# Patient Record
Sex: Male | Born: 1935 | Race: Black or African American | Hispanic: No | Marital: Married | Smoking: Never smoker
Health system: Southern US, Community
[De-identification: ages and names within clinical notes are randomized; demographics above are authoritative.]

## PROBLEM LIST (undated history)

## (undated) DIAGNOSIS — I10 Essential (primary) hypertension: Secondary | ICD-10-CM

## (undated) DIAGNOSIS — I2510 Atherosclerotic heart disease of native coronary artery without angina pectoris: Secondary | ICD-10-CM

---

## 2010-01-31 ENCOUNTER — Emergency Department: Payer: Self-pay | Admitting: Emergency Medicine

## 2010-02-14 ENCOUNTER — Emergency Department: Payer: Self-pay | Admitting: Internal Medicine

## 2018-07-20 ENCOUNTER — Observation Stay
Admission: EM | Admit: 2018-07-20 | Discharge: 2018-07-21 | Disposition: A | Discharge status: Home / Self Care | Payer: Medicare Other | Attending: Internal Medicine | Admitting: Internal Medicine

## 2018-07-20 ENCOUNTER — Observation Stay: Payer: Medicare Other

## 2018-07-20 ENCOUNTER — Emergency Department: Payer: Medicare Other

## 2018-07-20 ENCOUNTER — Other Ambulatory Visit: Payer: Self-pay

## 2018-07-20 ENCOUNTER — Observation Stay (HOSPITAL_BASED_OUTPATIENT_CLINIC_OR_DEPARTMENT_OTHER)
Admit: 2018-07-20 | Discharge: 2018-07-20 | Disposition: A | Discharge status: Home / Self Care | Payer: Medicare Other | Attending: Cardiovascular Disease | Admitting: Cardiovascular Disease

## 2018-07-20 ENCOUNTER — Encounter: Payer: Self-pay | Admitting: Emergency Medicine

## 2018-07-20 DIAGNOSIS — K5730 Diverticulosis of large intestine without perforation or abscess without bleeding: Secondary | ICD-10-CM | POA: Diagnosis not present

## 2018-07-20 DIAGNOSIS — K219 Gastro-esophageal reflux disease without esophagitis: Secondary | ICD-10-CM | POA: Insufficient documentation

## 2018-07-20 DIAGNOSIS — N1330 Unspecified hydronephrosis: Secondary | ICD-10-CM | POA: Diagnosis not present

## 2018-07-20 DIAGNOSIS — I714 Abdominal aortic aneurysm, without rupture: Secondary | ICD-10-CM | POA: Diagnosis not present

## 2018-07-20 DIAGNOSIS — I10 Essential (primary) hypertension: Secondary | ICD-10-CM | POA: Insufficient documentation

## 2018-07-20 DIAGNOSIS — K529 Noninfective gastroenteritis and colitis, unspecified: Secondary | ICD-10-CM

## 2018-07-20 DIAGNOSIS — I513 Intracardiac thrombosis, not elsewhere classified: Secondary | ICD-10-CM | POA: Diagnosis not present

## 2018-07-20 DIAGNOSIS — J439 Emphysema, unspecified: Secondary | ICD-10-CM | POA: Diagnosis not present

## 2018-07-20 DIAGNOSIS — Z79899 Other long term (current) drug therapy: Secondary | ICD-10-CM | POA: Insufficient documentation

## 2018-07-20 DIAGNOSIS — Z720 Tobacco use: Secondary | ICD-10-CM

## 2018-07-20 DIAGNOSIS — I6523 Occlusion and stenosis of bilateral carotid arteries: Secondary | ICD-10-CM | POA: Diagnosis not present

## 2018-07-20 DIAGNOSIS — Z7902 Long term (current) use of antithrombotics/antiplatelets: Secondary | ICD-10-CM

## 2018-07-20 DIAGNOSIS — I723 Aneurysm of iliac artery: Secondary | ICD-10-CM | POA: Insufficient documentation

## 2018-07-20 DIAGNOSIS — R55 Syncope and collapse: Secondary | ICD-10-CM

## 2018-07-20 DIAGNOSIS — I2510 Atherosclerotic heart disease of native coronary artery without angina pectoris: Secondary | ICD-10-CM | POA: Insufficient documentation

## 2018-07-20 DIAGNOSIS — R112 Nausea with vomiting, unspecified: Secondary | ICD-10-CM | POA: Insufficient documentation

## 2018-07-20 DIAGNOSIS — E785 Hyperlipidemia, unspecified: Secondary | ICD-10-CM | POA: Insufficient documentation

## 2018-07-20 DIAGNOSIS — K4090 Unilateral inguinal hernia, without obstruction or gangrene, not specified as recurrent: Secondary | ICD-10-CM | POA: Insufficient documentation

## 2018-07-20 DIAGNOSIS — F17210 Nicotine dependence, cigarettes, uncomplicated: Secondary | ICD-10-CM | POA: Insufficient documentation

## 2018-07-20 DIAGNOSIS — F17200 Nicotine dependence, unspecified, uncomplicated: Secondary | ICD-10-CM | POA: Diagnosis not present

## 2018-07-20 DIAGNOSIS — I160 Hypertensive urgency: Secondary | ICD-10-CM | POA: Diagnosis not present

## 2018-07-20 DIAGNOSIS — N400 Enlarged prostate without lower urinary tract symptoms: Secondary | ICD-10-CM | POA: Insufficient documentation

## 2018-07-20 DIAGNOSIS — I493 Ventricular premature depolarization: Secondary | ICD-10-CM

## 2018-07-20 DIAGNOSIS — N281 Cyst of kidney, acquired: Secondary | ICD-10-CM | POA: Insufficient documentation

## 2018-07-20 DIAGNOSIS — Z7982 Long term (current) use of aspirin: Secondary | ICD-10-CM | POA: Diagnosis not present

## 2018-07-20 DIAGNOSIS — I739 Peripheral vascular disease, unspecified: Secondary | ICD-10-CM

## 2018-07-20 DIAGNOSIS — I25118 Atherosclerotic heart disease of native coronary artery with other forms of angina pectoris: Secondary | ICD-10-CM | POA: Diagnosis not present

## 2018-07-20 HISTORY — DX: Essential (primary) hypertension: I10

## 2018-07-20 HISTORY — DX: Atherosclerotic heart disease of native coronary artery without angina pectoris: I25.10

## 2018-07-20 LAB — CBC WITH DIFFERENTIAL/PLATELET
Abs Immature Granulocytes: 0.02 10*3/uL (ref 0.00–0.07)
Basophils Absolute: 0.1 10*3/uL (ref 0.0–0.1)
Basophils Relative: 1 %
Eosinophils Absolute: 0.2 10*3/uL (ref 0.0–0.5)
Eosinophils Relative: 3 %
HCT: 43.5 % (ref 39.0–52.0)
Hemoglobin: 13.8 g/dL (ref 13.0–17.0)
Immature Granulocytes: 0 %
Lymphocytes Relative: 33 %
Lymphs Abs: 2.3 10*3/uL (ref 0.7–4.0)
MCH: 27.2 pg (ref 26.0–34.0)
MCHC: 31.7 g/dL (ref 30.0–36.0)
MCV: 85.6 fL (ref 80.0–100.0)
MONOS PCT: 10 %
Monocytes Absolute: 0.7 10*3/uL (ref 0.1–1.0)
Neutro Abs: 3.6 10*3/uL (ref 1.7–7.7)
Neutrophils Relative %: 53 %
Platelets: 182 10*3/uL (ref 150–400)
RBC: 5.08 MIL/uL (ref 4.22–5.81)
RDW: 14.7 % (ref 11.5–15.5)
WBC: 7 10*3/uL (ref 4.0–10.5)
nRBC: 0 % (ref 0.0–0.2)

## 2018-07-20 LAB — ECHOCARDIOGRAM COMPLETE
Height: 69 in
Weight: 3152 oz

## 2018-07-20 LAB — COMPREHENSIVE METABOLIC PANEL
ALT: 14 U/L (ref 0–44)
AST: 17 U/L (ref 15–41)
Albumin: 3.7 g/dL (ref 3.5–5.0)
Alkaline Phosphatase: 61 U/L (ref 38–126)
Anion gap: 9 (ref 5–15)
BUN: 20 mg/dL (ref 8–23)
CHLORIDE: 105 mmol/L (ref 98–111)
CO2: 25 mmol/L (ref 22–32)
CREATININE: 1.11 mg/dL (ref 0.61–1.24)
Calcium: 8.7 mg/dL — ABNORMAL LOW (ref 8.9–10.3)
GFR calc Af Amer: >60 mL/min (ref >60)
GFR calc non Af Amer: >60 mL/min (ref >60)
Glucose, Bld: 161 mg/dL — ABNORMAL HIGH (ref 70–99)
Potassium: 3.9 mmol/L (ref 3.5–5.1)
Sodium: 139 mmol/L (ref 135–145)
Total Bilirubin: 0.5 mg/dL (ref 0.3–1.2)
Total Protein: 6.5 g/dL (ref 6.5–8.1)

## 2018-07-20 LAB — LIPASE, BLOOD: Lipase: 41 U/L (ref 11–51)

## 2018-07-20 LAB — TROPONIN I: Troponin I: <0.03 ng/mL (ref <0.03)

## 2018-07-20 MED ORDER — TRAZODONE HCL 50 MG PO TABS: 25.0000 mg | ORAL_TABLET | Freq: Every evening | ORAL | Status: DC | PRN

## 2018-07-20 MED ORDER — FENTANYL CITRATE (PF) 100 MCG/2ML IJ SOLN
INTRAMUSCULAR | Status: AC
  Filled 2018-07-20: qty 2

## 2018-07-20 MED ORDER — SODIUM CHLORIDE 0.9% FLUSH
3.0000 mL | Freq: Two times a day (BID) | INTRAVENOUS | Status: DC
  Administered 2018-07-20: 3 mL via INTRAVENOUS

## 2018-07-20 MED ORDER — NITROGLYCERIN 2 % TD OINT
0.5000 [in_us] | TOPICAL_OINTMENT | Freq: Once | TRANSDERMAL | Status: AC
  Administered 2018-07-20: 0.5 [in_us] via TOPICAL
  Filled 2018-07-20: qty 1

## 2018-07-20 MED ORDER — FENTANYL CITRATE (PF) 100 MCG/2ML IJ SOLN
50.0000 ug | Freq: Once | INTRAMUSCULAR | Status: AC
  Administered 2018-07-20: 50 ug via INTRAVENOUS
  Filled 2018-07-20: qty 2

## 2018-07-20 MED ORDER — METOCLOPRAMIDE HCL 5 MG/ML IJ SOLN: 10.0000 mg | Freq: Three times a day (TID) | INTRAMUSCULAR | Status: DC | PRN

## 2018-07-20 MED ORDER — ENOXAPARIN SODIUM 40 MG/0.4ML ~~LOC~~ SOLN
40.0000 mg | SUBCUTANEOUS | Status: DC
  Filled 2018-07-20: qty 0.4

## 2018-07-20 MED ORDER — HYDRALAZINE HCL 20 MG/ML IJ SOLN: 10.0000 mg | Freq: Four times a day (QID) | INTRAMUSCULAR | Status: DC | PRN

## 2018-07-20 MED ORDER — FENTANYL CITRATE (PF) 100 MCG/2ML IJ SOLN
50.0000 ug | Freq: Once | INTRAMUSCULAR | Status: AC
  Administered 2018-07-20: 50 ug via INTRAVENOUS

## 2018-07-20 MED ORDER — NITROGLYCERIN 2 % TD OINT: 0.5000 [in_us] | TOPICAL_OINTMENT | Freq: Once | TRANSDERMAL | Status: DC

## 2018-07-20 MED ORDER — ONDANSETRON HCL 4 MG/2ML IJ SOLN: 4.0000 mg | Freq: Four times a day (QID) | INTRAMUSCULAR | Status: DC | PRN

## 2018-07-20 MED ORDER — FENTANYL CITRATE (PF) 100 MCG/2ML IJ SOLN: 25.0000 ug | INTRAMUSCULAR | Status: DC | PRN

## 2018-07-20 MED ORDER — SODIUM CHLORIDE 0.9 % IV SOLN
INTRAVENOUS | Status: DC
  Administered 2018-07-20 – 2018-07-21 (×3): via INTRAVENOUS

## 2018-07-20 MED ORDER — METOCLOPRAMIDE HCL 5 MG/ML IJ SOLN
10.0000 mg | Freq: Once | INTRAMUSCULAR | Status: AC
  Administered 2018-07-20: 10 mg via INTRAVENOUS

## 2018-07-20 MED ORDER — ACETAMINOPHEN 325 MG PO TABS: 650.0000 mg | ORAL_TABLET | Freq: Four times a day (QID) | ORAL | Status: DC | PRN

## 2018-07-20 MED ORDER — KETOROLAC TROMETHAMINE 30 MG/ML IJ SOLN: 15.0000 mg | Freq: Four times a day (QID) | INTRAMUSCULAR | Status: DC | PRN

## 2018-07-20 MED ORDER — IOPAMIDOL (ISOVUE-370) INJECTION 76%
100.0000 mL | Freq: Once | INTRAVENOUS | Status: AC | PRN
  Administered 2018-07-20: 100 mL via INTRAVENOUS

## 2018-07-20 MED ORDER — MAGNESIUM HYDROXIDE 400 MG/5ML PO SUSP
30.0000 mL | Freq: Every day | ORAL | Status: DC | PRN
  Filled 2018-07-20: qty 30

## 2018-07-20 MED ORDER — ACETAMINOPHEN 650 MG RE SUPP: 650.0000 mg | Freq: Four times a day (QID) | RECTAL | Status: DC | PRN

## 2018-07-20 NOTE — ED Provider Notes (Signed)
[facility] Emergency Department Provider Note    First MD Initiated Contact with Patient 07/20/18 [phone]     (approximate)  I have reviewed the triage vital signs and the nursing notes.   HISTORY  Chief Complaint Loss of Consciousness and Abdominal Pain    HPI Timothy [name] is a 85 y.o. male with history of coronary artery disease, hypertension and 3.2 cm AAA presents to the emergency department with syncopal episode after using the bathroom this morning.  Patient admits to 10 out of 10 generalized abdominal pain with accompanying nausea since the event.  Patient denies any chest pain no shortness of breath.  Patient denies any other complaints.        Past Medical History:  Diagnosis Date   Coronary artery disease    Hypertension     Patient Active Problem List   Diagnosis Date Noted   Syncope 07/20/2018      Prior to Admission medications   Not on File    Allergies Morphine and related  No family history on file.  Social History Social History   Tobacco Use   Smoking status: Not on file  Substance Use Topics   Alcohol use: Not on file   Drug use: Not on file    Review of Systems Constitutional: No fever/chills Eyes: No visual changes. ENT: No sore throat. Cardiovascular: Denies chest pain. Respiratory: Denies shortness of breath. Gastrointestinal: Positive for abdominal pain.  No nausea, no vomiting.  No diarrhea.  No constipation. Genitourinary: Negative for dysuria. Musculoskeletal: Negative for neck pain.  Negative for back pain. Integumentary: Negative for rash. Neurological: Negative for headaches, focal weakness or numbness.   ____________________________________________   PHYSICAL EXAM:  VITAL SIGNS: ED Triage Vitals  Enc Vitals Group     BP 07/20/18 0430 (!) 186/76     Pulse Rate 07/20/18 0445 (!) 57     Resp 07/20/18 0430 17     Temp 07/20/18 0435 98.6 F (37 C)     Temp Source 07/20/18 0435  Oral     SpO2 07/20/18 0430 95 %     Weight 07/20/18 0430 89.4 kg (197 lb)     Height 07/20/18 0430 1.753 m (5\' 9" )     Head Circumference --      Peak Flow --      Pain Score 07/20/18 0429 7     Pain Loc --      Pain Edu? --      Excl. in GC? --     Constitutional: Alert and oriented.  Apparent discomfort  eyes: Conjunctivae are normal. PERRL. EOMI. Mouth/Throat: Mucous membranes are moist.  Oropharynx non-erythematous. Neck: No stridor.  Cardiovascular: Normal rate, regular rhythm. Good peripheral circulation. Grossly normal heart sounds. Respiratory: Normal respiratory effort.  No retractions. Lungs CTAB. Gastrointestinal: Generalized tenderness to palpation.. No distention.  Musculoskeletal: No lower extremity tenderness nor edema. No gross deformities of extremities. Neurologic:  Normal speech and language. No gross focal neurologic deficits are appreciated.  Skin:  Skin is warm, dry and intact. No rash noted. Psychiatric: Mood and affect are normal. Speech and behavior are normal.  ____________________________________________   LABS (all labs ordered are listed, but only abnormal results are displayed)  Labs Reviewed  COMPREHENSIVE METABOLIC PANEL - Abnormal; Notable for the following components:      Result Value   Glucose, Bld 161 (*)    Calcium 8.7 (*)    All other components within normal limits  CBC WITH  DIFFERENTIAL/PLATELET  LIPASE, BLOOD  TROPONIN I   ____________________________________________  EKG  ED ECG REPORT I, [place] Nastashia Gallo, the attending physician, personally viewed and interpreted this ECG.   Date: 07/20/2018  EKG Time: 4:32 AM  Rate: 78  Rhythm: Bigeminy  Axis: Normal  Intervals: Normal  ST&T Change: None  ____________________________________________  RADIOLOGY I, [place] N Daryle Amis, personally viewed and evaluated these images (plain radiographs) as part of my medical decision making, as well as reviewing the written report by the  radiologist.  ED MD interpretation: CT angiogram of the abdomen performed revealed 3.3 cm infrarenal AAA similar in size to outside CT from 02/10/2017 no rupture aneurysm may still be symptomatic given mild fat haziness along the sac near the IMA  Official radiology report(s): Ct Angio Abd/pel W/ And/or W/o  Result Date: 07/20/2018 CLINICAL DATA:  Generalized abdominal pain with syncope. History of abdominal aortic aneurysm. EXAM: CTA ABDOMEN AND PELVIS wITHOUT AND WITH CONTRAST TECHNIQUE: Multidetector CT imaging of the abdomen and pelvis was performed using the standard protocol during bolus administration of intravenous contrast. Multiplanar reconstructed images and MIPs were obtained and reviewed to evaluate the vascular anatomy. CONTRAST:  ISOVUE-370 IOPAMIDOL (ISOVUE-370) INJECTION 76% COMPARISON:  Abdominal CT report from [facility] 02/10/2017 FINDINGS: VASCULAR Aorta: Atheromatous calcification with mural thrombus throughout the abdominal aorta. Fusiform infrarenal abdominal aortic aneurysm measuring up to 3.3 cm, 1 mm larger than in 2018. No intimal calcification disruption or visible intramural hematoma. There is mild fat stranding along the anterior aneurysm seen at the level of the IMA. No aortic draping. Celiac: Plaque at the origin without stenosis.  No branch occlusion. SMA: Diffusely patent. No branch occlusion or flow limiting stenosis Renals: Accessory lower pole renal arteries, stenotic at the right lower pole with associated atrophy. No emergent finding. IMA: Patent Inflow: Diffuse irregular atherosclerotic plaque without flow limiting stenosis. Left common iliac artery measures up to 17 mm in diameter. Proximal Outflow: Atheromatous plaque without flow limiting stenosis. Veins: Unremarkable Review of the MIP images confirms the above findings. NON-VASCULAR Lower chest: Bilateral hilar/bronchial adenopathy, not described on a chest CT report at [facility] 08/11/2015. Paraseptal emphysema and  atelectasis or scarring. Extensive coronary calcification. Hiatal hernia. Hepatobiliary: Small low-density in the upper left lobe liver, too small for densitometry. Thickening of the gallbladder fundus with central low-density, correlating with outside abdominal CT report of adenomyomatosis. Pancreas: Negative Spleen: Negative Adrenals/Urinary Tract: Bilateral renal cystic densities. Negative adrenals. No hydronephrosis or ureteral calculus. Negative urinary bladder. Stomach/Bowel: Negative for obstruction or visible inflammation. Extensive colonic diverticulosis. Lymphatic: Negative for mass or definite adenopathy. Reproductive: Symmetric enlargement of the prostate Other: No ascites or pneumoperitoneum.  Left inguinal hernia. Musculoskeletal: Generalized spinal degeneration. No acute or aggressive finding. IMPRESSION: VASCULAR 1. Abdominal aortic aneurysm measuring up to 3.3 cm, similar in size to outside CT report 02/10/2017. No aortic rupture. The aneurysm may still be symptomatic as there is mild fat haziness along the sac near the IMA origin. 2. Extensive atherosclerotic plaque. 3. 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 NON-VASCULAR 1. Hilar/bronchial adenopathy partially visualized. This was not reported on a 2017 outside chest CT, recommend non emergent follow-up chest CT. 2.  Multiple chronic findings are described above. Electronically Signed   By: Marnee Spring M.D.   On: 07/20/2018 05:39     Procedures   ____________________________________________   INITIAL IMPRESSION / MDM / ASSESSMENT AND PLAN / ED COURSE  As part of my medical decision making, I  reviewed the following data within the electronic MEDICAL RECORD NUMBER   85 year old male presenting with above-stated history and physical exam following a syncopal episode.  Concern for possible cardiac etiology given bigeminy.  Regarding the patient's abdominal pain concern for possible dissection of  known AAA.  CT scan of the abdomen pelvis revealed 3.3 cm aneurysm consistent with CTs done at [facility] October 2018 without any evidence of dissection.  Patient is given IV fentanyl in the emergency department with improvement of pain and also given IV Reglan with improvement of nausea.  Patient discussed with Dr. Jarome Lamas for hospital admission for further evaluation and management of syncope.  Patient also discussed with Dr. Gilda Crease vascular surgery who will see the patient in consultation as well.  On reevaluation patient's pain improved as well as nausea.    ____________________________________________  FINAL CLINICAL IMPRESSION(S) / ED DIAGNOSES  Final diagnoses:  Syncope     MEDICATIONS GIVEN DURING THIS VISIT:  Medications  fentaNYL (SUBLIMAZE) injection 25 mcg (has no administration in time range)  hydrALAZINE (APRESOLINE) injection 10 mg (has no administration in time range)  metoCLOPramide (REGLAN) injection 10 mg (has no administration in time range)  ondansetron (ZOFRAN) injection 4 mg (has no administration in time range)  ketorolac (TORADOL) 30 MG/ML injection 15 mg (has no administration in time range)  fentaNYL (SUBLIMAZE) injection 50 mcg (50 mcg Intravenous Given 07/20/18 0433)  iopamidol (ISOVUE-370) 76 % injection 100 mL (100 mLs Intravenous Contrast Given 07/20/18 0438)  nitroGLYCERIN (NITROGLYN) 2 % ointment 0.5 inch (0.5 inches Topical Given 07/20/18 0529)  metoCLOPramide (REGLAN) injection 10 mg (10 mg Intravenous Given 07/20/18 0528)  fentaNYL (SUBLIMAZE) injection 50 mcg (50 mcg Intravenous Given 07/20/18 1610)     ED Discharge Orders    None       Note:  This document was prepared using Dragon voice recognition software and may include unintentional dictation errors.   Darci Current, MD 07/20/18 [phone]

## 2018-07-20 NOTE — ED Triage Notes (Signed)
pt presents via acems with c/o syncopal episode after using bathroom. Pt has hx of triple A. Pt has abdomnial pain and nausea at this time as well. NSR of 48 for ems. Pt denies chest pain or shortness of breath at this time allergic to morphine.

## 2018-07-20 NOTE — Progress Notes (Signed)
CCMD called to inform me that the patient is doing PVC's in pairs.  Dr Gwen Pounds notified

## 2018-07-20 NOTE — ED Notes (Signed)
.. ED TO INPATIENT HANDOFF REPORT  ED Nurse Name and Phone #: Pattricia Boss [phone]  S Name/Age/Gender Timothy [name] 85 y.o. male Room/Bed: ED09A/ED09A  Code Status   Code Status: Not on file  Home/SNF/Other Home Patient oriented to: self, place, time and situation Is this baseline? Yes   Triage Complete: Triage complete  Chief Complaint syncope  Triage Note pt presents via acems with c/o syncopal episode after using bathroom. Pt has hx of triple A. Pt has abdomnial pain and nausea at this time as well. NSR of 48 for ems. Pt denies chest pain or shortness of breath at this time allergic to morphine.   Allergies Allergies  Allergen Reactions  . Morphine And Related Rash    Level of Care/Admitting Diagnosis ED Disposition    ED Disposition Condition Comment   Admit  The patient appears reasonably stabilized for admission considering the current resources, flow, and capabilities available in the ED at this time, and I doubt any other [facility] requiring further screening and/or treatment in the ED prior to admission is  present.       B Medical/Surgery History Past Medical History:  Diagnosis Date  . Coronary artery disease   . Hypertension       A IV Location/Drains/Wounds Patient Lines/Drains/Airways Status   Active Line/Drains/Airways    Name:   Placement date:   Placement time:   Site:   Days:   Peripheral IV 07/20/18 Left Antecubital   07/20/18    0435    Antecubital   less than 1          Intake/Output Last 24 hours No intake or output data in the 24 hours ending 07/20/18 0556  Labs/Imaging Results for orders placed or performed during the hospital encounter of 07/20/18 (from the past 48 hour(s))  CBC with Differential     Status: None   Collection Time: 07/20/18  4:36 AM  Result Value Ref Range   WBC 7.0 4.0 - 10.5 K/uL   RBC 5.08 4.22 - 5.81 MIL/uL   Hemoglobin 13.8 13.0 - 17.0 g/dL   HCT 88.7 19.5 - 97.4 %   MCV 85.6 80.0 - 100.0 fL   MCH 27.2 26.0 -  34.0 pg   MCHC 31.7 30.0 - 36.0 g/dL   RDW 71.8 55.0 - 15.8 %   Platelets 182 150 - 400 K/uL   nRBC 0.0 0.0 - 0.2 %   Neutrophils Relative % 53 %   Neutro Abs 3.6 1.7 - 7.7 K/uL   Lymphocytes Relative 33 %   Lymphs Abs 2.3 0.7 - 4.0 K/uL   Monocytes Relative 10 %   Monocytes Absolute 0.7 0.1 - 1.0 K/uL   Eosinophils Relative 3 %   Eosinophils Absolute 0.2 0.0 - 0.5 K/uL   Basophils Relative 1 %   Basophils Absolute 0.1 0.0 - 0.1 K/uL   Immature Granulocytes 0 %   Abs Immature Granulocytes 0.02 0.00 - 0.07 K/uL    Comment: Performed at [facility], [address] [place], [place] [address]  Comprehensive metabolic panel     Status: Abnormal   Collection Time: 07/20/18  4:36 AM  Result Value Ref Range   Sodium 139 135 - 145 mmol/L   Potassium 3.9 3.5 - 5.1 mmol/L   Chloride 105 98 - 111 mmol/L   CO2 25 22 - 32 mmol/L   Glucose, Bld 161 (H) 70 - 99 mg/dL   BUN 20 8 - 23 mg/dL   Creatinine, Ser 4.93  0.61 - 1.24 mg/dL   Calcium 8.7 (L) 8.9 - 10.3 mg/dL   Total Protein 6.5 6.5 - 8.1 g/dL   Albumin 3.7 3.5 - 5.0 g/dL   AST 17 15 - 41 U/L   ALT 14 0 - 44 U/L   Alkaline Phosphatase 61 38 - 126 U/L   Total Bilirubin 0.5 0.3 - 1.2 mg/dL   GFR calc non Af Amer >60 >60 mL/min   GFR calc Af Amer >60 >60 mL/min   Anion gap 9 5 - 15    Comment: Performed at [facility], [address] [place], [place] [address]  Lipase, blood     Status: None   Collection Time: 07/20/18  4:36 AM  Result Value Ref Range   Lipase 41 11 - 51 U/L    Comment: Performed at [facility], [address] [place], [place] [address]  Troponin I - ONCE - STAT     Status: None   Collection Time: 07/20/18  4:36 AM  Result Value Ref Range   Troponin I <0.03 <0.03 ng/mL    Comment: Performed at [facility], [address] [place], [place] [address]   Ct Angio Abd/pel W/ And/or W/o  Result Date: 07/20/2018 CLINICAL DATA:  Generalized abdominal pain with syncope.  History of abdominal aortic aneurysm. EXAM: CTA ABDOMEN AND PELVIS wITHOUT AND WITH CONTRAST TECHNIQUE: Multidetector CT imaging of the abdomen and pelvis was performed using the standard protocol during bolus administration of intravenous contrast. Multiplanar reconstructed images and MIPs were obtained and reviewed to evaluate the vascular anatomy. CONTRAST:  ISOVUE-370 IOPAMIDOL (ISOVUE-370) INJECTION 76% COMPARISON:  Abdominal CT report from [facility] 02/10/2017 FINDINGS: VASCULAR Aorta: Atheromatous calcification with mural thrombus throughout the abdominal aorta. Fusiform infrarenal abdominal aortic aneurysm measuring up to 3.3 cm, 1 mm larger than in 2018. No intimal calcification disruption or visible intramural hematoma. There is mild fat stranding along the anterior aneurysm seen at the level of the IMA. No aortic draping. Celiac: Plaque at the origin without stenosis.  No branch occlusion. SMA: Diffusely patent. No branch occlusion or flow limiting stenosis Renals: Accessory lower pole renal arteries, stenotic at the right lower pole with associated atrophy. No emergent finding. IMA: Patent Inflow: Diffuse irregular atherosclerotic plaque without flow limiting stenosis. Left common iliac artery measures up to 17 mm in diameter. Proximal Outflow: Atheromatous plaque without flow limiting stenosis. Veins: Unremarkable Review of the MIP images confirms the above findings. NON-VASCULAR Lower chest: Bilateral hilar/bronchial adenopathy, not described on a chest CT report at [facility] 08/11/2015. Paraseptal emphysema and atelectasis or scarring. Extensive coronary calcification. Hiatal hernia. Hepatobiliary: Small low-density in the upper left lobe liver, too small for densitometry. Thickening of the gallbladder fundus with central low-density, correlating with outside abdominal CT report of adenomyomatosis. Pancreas: Negative Spleen: Negative Adrenals/Urinary Tract: Bilateral renal cystic densities. Negative  adrenals. No hydronephrosis or ureteral calculus. Negative urinary bladder. Stomach/Bowel: Negative for obstruction or visible inflammation. Extensive colonic diverticulosis. Lymphatic: Negative for mass or definite adenopathy. Reproductive: Symmetric enlargement of the prostate Other: No ascites or pneumoperitoneum.  Left inguinal hernia. Musculoskeletal: Generalized spinal degeneration. No acute or aggressive finding. IMPRESSION: VASCULAR 1. Abdominal aortic aneurysm measuring up to 3.3 cm, similar in size to outside CT report 02/10/2017. No aortic rupture. The aneurysm may still be symptomatic as there is mild fat haziness along the sac near the IMA origin. 2. Extensive atherosclerotic plaque. 3. 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 NON-VASCULAR  1. Hilar/bronchial adenopathy partially visualized. This was not reported on a 2017 outside chest CT, recommend non emergent follow-up chest CT. 2.  Multiple chronic findings are described above. Electronically Signed   By: Marnee Spring M.D.   On: 07/20/2018 05:39    Pending Labs Unresulted Labs (From admission, onward)   None      Vitals/Pain Today's Vitals   07/20/18 0435 07/20/18 0445 07/20/18 0505 07/20/18 0516  BP:  (!) 154/83 (!) 165/77 (!) 185/78  Pulse:  (!) 57 (!) 59   Resp:  18  (!) 22  Temp: 98.6 F (37 C)     TempSrc: Oral     SpO2:  92% 93% 96%  Weight:      Height:      PainSc:        Isolation Precautions No active isolations  Medications Medications  fentaNYL (SUBLIMAZE) injection 50 mcg (50 mcg Intravenous Given 07/20/18 0433)  iopamidol (ISOVUE-370) 76 % injection 100 mL (100 mLs Intravenous Contrast Given 07/20/18 0438)  nitroGLYCERIN (NITROGLYN) 2 % ointment 0.5 inch (0.5 inches Topical Given 07/20/18 0529)  metoCLOPramide (REGLAN) injection 10 mg (10 mg Intravenous Given 07/20/18 0528)  fentaNYL (SUBLIMAZE) injection 50 mcg (50 mcg Intravenous Given 07/20/18 0552)     Mobility walks Low fall risk   Focused Assessments Cardiac Assessment Handoff:  Cardiac Rhythm: Sinus bradycardia, Normal sinus rhythm Lab Results  Component Value Date   TROPONINI <0.03 07/20/2018   No results found for: DDIMER Does the Patient currently have chest pain? No     R Recommendations: See Admitting Provider Note  Report given to:   Additional Notes:

## 2018-07-20 NOTE — Progress Notes (Signed)
[facility] [place] [facility]   PATIENT NAME: Timothy [name]    MR#:  [id number]  DATE OF BIRTH:  May 27, 1931  SUBJECTIVE:  CHIEF COMPLAINT:   Chief Complaint  Patient presents with  . Loss of Consciousness  . Abdominal Pain   No new complaint this morning.  No fevers overnight.  Patient resting comfortably. REVIEW OF SYSTEMS:  Review of Systems  Constitutional: Negative for chills and fever.  HENT: Negative for hearing loss and tinnitus.   Eyes: Negative for blurred vision and double vision.  Respiratory: Negative for cough and hemoptysis.   Cardiovascular: Negative for chest pain and palpitations.  Gastrointestinal: Negative for abdominal pain, heartburn, nausea and vomiting.  Genitourinary: Negative for dysuria and urgency.  Musculoskeletal: Negative for myalgias and neck pain.  Skin: Negative for itching and rash.  Neurological: Negative for dizziness and headaches.  Psychiatric/Behavioral: Negative for depression and hallucinations.    DRUG ALLERGIES:   Allergies  Allergen Reactions  . Morphine And Related Rash   VITALS:  Blood pressure 133/80, pulse (!) 52, temperature (!) 97.4 F (36.3 C), temperature source Oral, resp. rate (!) 22, height 5\' 9"  (1.753 m), weight 89.4 kg, SpO2 95 %. PHYSICAL EXAMINATION:   Physical Exam  Constitutional: He is oriented to person, place, and time. He appears well-developed and well-nourished.  HENT:  Head: Normocephalic.  Right Ear: External ear normal.  Mouth/Throat: Oropharynx is clear and moist.  Eyes: Pupils are equal, round, and reactive to light. Conjunctivae are normal. Right eye exhibits no discharge.  Neck: Normal range of motion. Neck supple. No thyromegaly present.  Cardiovascular: Normal rate, regular rhythm and normal heart sounds.  Respiratory: Effort normal and breath sounds normal. No respiratory distress.  GI: Soft. Bowel sounds are normal. He exhibits no distension. There is no abdominal  tenderness.  Musculoskeletal: Normal range of motion.        General: No edema.  Neurological: He is alert and oriented to person, place, and time. No cranial nerve deficit.  Skin: Skin is warm. He is not diaphoretic. No erythema.  Psychiatric: He has a normal mood and affect. His behavior is normal.   LABORATORY PANEL:  Male CBC Recent Labs  Lab 07/20/18 0436  WBC 7.0  HGB 13.8  HCT 43.5  PLT 182   ------------------------------------------------------------------------------------------------------------------ Chemistries  Recent Labs  Lab 07/20/18 0436  NA 139  K 3.9  CL 105  CO2 25  GLUCOSE 161*  BUN 20  CREATININE 1.11  CALCIUM 8.7*  AST 17  ALT 14  ALKPHOS 61  BILITOT 0.5   RADIOLOGY:  US Carotid Bilateral  Result Date: 07/20/2018 CLINICAL DATA:  Syncope EXAM: BILATERAL CAROTID DUPLEX ULTRASOUND TECHNIQUE: Wallace Cullens scale imaging, color Doppler and duplex ultrasound were performed of bilateral carotid and vertebral arteries in the neck. COMPARISON:  None. FINDINGS: Criteria: Quantification of carotid stenosis is based on velocity parameters that correlate the residual internal carotid diameter with NASCET-based stenosis levels, using the diameter of the distal internal carotid lumen as the denominator for stenosis measurement. The following velocity measurements were obtained: RIGHT ICA: 222 cm/sec CCA: 100 cm/sec SYSTOLIC ICA/CCA RATIO:  2.2 ECA: 89 cm/sec LEFT ICA: Occluded cm/sec CCA: 59 cm/sec SYSTOLIC ICA/CCA RATIO:  Occluded ECA: 126 cm/sec RIGHT CAROTID ARTERY: Mild smooth mixed plaque in the upper common carotid artery. Mild focal calcified plaque in the bulb. Low resistance internal carotid Doppler pattern is preserved. There is circumferential mixed plaque in the proximal internal carotid artery causing narrowing  visualized on color Doppler imaging. RIGHT VERTEBRAL ARTERY: Antegrade. Right subclavian artery Doppler pattern is triphasic. LEFT CAROTID ARTERY: There is  extensive calcified plaque in the bulb. The internal carotid artery is occluded at the level of the bulb. LEFT VERTEBRAL ARTERY: Antegrade. Left subclavian artery Doppler pattern is triphasic. 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 Bilateral vertebral arteries are patent with antegrade flow. Electronically Signed   By: Jolaine Click M.D.   On: 07/20/2018 10:30   Ct Angio Abd/pel W/ And/or W/o  Result Date: 07/20/2018 CLINICAL DATA:  Generalized abdominal pain with syncope. History of abdominal aortic aneurysm. EXAM: CTA ABDOMEN AND PELVIS wITHOUT AND WITH CONTRAST TECHNIQUE: Multidetector CT imaging of the abdomen and pelvis was performed using the standard protocol during bolus administration of intravenous contrast. Multiplanar reconstructed images and MIPs were obtained and reviewed to evaluate the vascular anatomy. CONTRAST:  ISOVUE-370 IOPAMIDOL (ISOVUE-370) INJECTION 76% COMPARISON:  Abdominal CT report from [facility] 02/10/2017 FINDINGS: VASCULAR Aorta: Atheromatous calcification with mural thrombus throughout the abdominal aorta. Fusiform infrarenal abdominal aortic aneurysm measuring up to 3.3 cm, 1 mm larger than in 2018. No intimal calcification disruption or visible intramural hematoma. There is mild fat stranding along the anterior aneurysm seen at the level of the IMA. No aortic draping. Celiac: Plaque at the origin without stenosis.  No branch occlusion. SMA: Diffusely patent. No branch occlusion or flow limiting stenosis Renals: Accessory lower pole renal arteries, stenotic at the right lower pole with associated atrophy. No emergent finding. IMA: Patent Inflow: Diffuse irregular atherosclerotic plaque without flow limiting stenosis. Left common iliac artery measures up to 17 mm in diameter. Proximal Outflow: Atheromatous plaque without flow limiting stenosis. Veins: Unremarkable Review of the MIP images confirms the above  findings. NON-VASCULAR Lower chest: Bilateral hilar/bronchial adenopathy, not described on a chest CT report at [facility] 08/11/2015. Paraseptal emphysema and atelectasis or scarring. Extensive coronary calcification. Hiatal hernia. Hepatobiliary: Small low-density in the upper left lobe liver, too small for densitometry. Thickening of the gallbladder fundus with central low-density, correlating with outside abdominal CT report of adenomyomatosis. Pancreas: Negative Spleen: Negative Adrenals/Urinary Tract: Bilateral renal cystic densities. Negative adrenals. No hydronephrosis or ureteral calculus. Negative urinary bladder. Stomach/Bowel: Negative for obstruction or visible inflammation. Extensive colonic diverticulosis. Lymphatic: Negative for mass or definite adenopathy. Reproductive: Symmetric enlargement of the prostate Other: No ascites or pneumoperitoneum.  Left inguinal hernia. Musculoskeletal: Generalized spinal degeneration. No acute or aggressive finding. IMPRESSION: VASCULAR 1. Abdominal aortic aneurysm measuring up to 3.3 cm, similar in size to outside CT report 02/10/2017. No aortic rupture. The aneurysm may still be symptomatic as there is mild fat haziness along the sac near the IMA origin. 2. Extensive atherosclerotic plaque. 3. 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 NON-VASCULAR 1. Hilar/bronchial adenopathy partially visualized. This was not reported on a 2017 outside chest CT, recommend non emergent follow-up chest CT. 2.  Multiple chronic findings are described above. Electronically Signed   By: Marnee Spring M.D.   On: 07/20/2018 05:39   ASSESSMENT AND PLAN:   1.  Syncope:  Patient admitted to telemetry.   Denies any chest pain.  No shortness of breath.   Orthostatic vitals checked with evidence of orthostatic hypotension.  Continue IV fluid hydration.  Carotid Doppler ultrasound done revealed 50-69% stenosis in the right internal carotid artery. Left  internal carotid artery is occluded.  I discussed findings with vascular surgery.  They are  planning for CTA neck tomorrow. 2D echocardiogram done.  Follow-up report when read.  2.  Hypertensive urgency.   Blood pressure fairly controlled .  Resumed home dose of metoprolol and lisinopril.  Monitor.   3.  Pulsus bigeminy in the setting of syncope.  Seen by cardiologist.  Findings on carotid ultrasound noted.  CT neck already ordered for tomorrow by vascular surgery.  Follow-up on 2D echocardiogram when read.  No further cardiac work-up recommended at this time.  Follow-up with [facility] cardiology post discharge from the hospital.  4.  Abdominal pain.  He had associated nausea and vomiting.  No acute findings on abdominal and pelvic CT scan but revealed hilar/bronchial adenopathy partially visualized. This was not reported on a 2017 outside chest CT, recommend non emergent follow-up chest CT. Symptoms already significantly improved this morning.  Likely due to acute gastritis.  No diarrhea  5.  Abdominal aortic aneurysm.   Abdominal aortic aneurysm measuring up to 3.3 cm, similar in size to outside CT report 02/10/2017. Patient seen by vascular surgery service.  No plans for any emergency intervention.  If patient continues to have abdominal pains, vascular surgery will consider intervention on Friday.  6.  Coronary artery disease.    Stable he did not have any chest pain.  7.  DVT prophylaxis.  Lovenox   All the records are reviewed and case discussed with Care Management/Social Worker. Management plans discussed with the patient, family and they are in agreement.  CODE STATUS: Full Code  TOTAL TIME TAKING CARE OF THIS PATIENT: 35 minutes.   More than 50% of the time was spent in counseling/coordination of care: YES  POSSIBLE D/C IN 2 DAYS, DEPENDING ON CLINICAL CONDITION.   Sharonica Kraszewski M.D on 07/20/2018 at 2:15 PM  Between 7am to 6pm - Pager - [phone]  After 6pm go to  [email or web address] - Social research officer, government  [facility]  Office  [phone]  CC: Primary care physician; System, Pcp Not In  Note: This dictation was prepared with Dragon dictation along with smaller phrase technology. Any transcriptional errors that result from this process are unintentional.

## 2018-07-20 NOTE — Consult Note (Signed)
**Timothy [name]** Timothy Timothy [name]  MRN : [id number]  Timothy Timothy [name] is Timothy [name] 85 y.o. ([birth year]) male who presents with chief complaint of  Chief Complaint  Patient presents with  . Loss of Consciousness  . Abdominal Pain   History of Present Illness:  The patient is an 85 year old male with Timothy [name] past medical history of hypertension, coronary artery disease, AAA, ongoing tobacco abuse who presented to the [facility] emergency department early this morning after two episodes of syncope.  Patient endorses Timothy [name] history of waking up in the middle of night and not feeling "well".  He decided to go to the kitchen to get Timothy [name] drink of water became very dizzy and "passed out".  The patient mentioned this happened twice that he called his daughter who is Timothy [name] Art therapist and she advised him to seek medical attention in our ED.  During these episodes, the patient denied any chest pain, palpitations or shortness of breath.  He denies any injuries from his falls.  Emergency room notes mention patient stated he was nauseous, having abdominal pain without diarrhea.  Upon my conversation with him today he is denying any abdominal pain.  Denies any fever.  Patient denies any nausea vomiting today.  Upon presentation to the emergency room, his blood pressure was initially elevated at 186/76 with Timothy [name] pulse of 62 respiratory 17 pulse currently was 95% on room air.  His labs including CBC and CMP were essentially unremarkable and his troponin I was less than 0.03.  Serum lipase was normal.  His EKG showed pulseless bigeminy with Timothy [name] rate of 78 with right axis deviation.  CTA Timothy [name]/P on 07/20/18: 1. Abdominal aortic aneurysm measuring up to 3.3 cm, similar in size to outside CT report 02/10/2017. No aortic rupture. The aneurysm may still be symptomatic as there is mild fat haziness along the sac near the IMA origin. 2. Extensive atherosclerotic plaque. 3. 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  Of Timothy [name]: As part of his work-up for syncope, the patient underwent Timothy [name] bilateral carotid duplex which was notable for: 1) 50-69% stenosis in the right internal carotid artery 2) Left internal carotid artery is occluded. 3) Bilateral vertebral arteries are patent with antegrade flow.  Vascular surgery was consulted by Dr. Enid Baas for further recommendations  Current Facility-Administered Medications  Medication Dose Route Frequency Provider Last Rate Last Dose  . 0.9 %  sodium chloride infusion   Intravenous Continuous Timothy Timothy [name], Timothy A, MD 100 mL/hr at 07/20/18 1043    . acetaminophen (TYLENOL) tablet 650 mg  650 mg Oral Q6H PRN Timothy Timothy [name], Timothy A, MD       Or  . acetaminophen (TYLENOL) suppository 650 mg  650 mg Rectal Q6H PRN Timothy Timothy [name], Timothy A, MD      . enoxaparin (LOVENOX) injection 40 mg  40 mg Subcutaneous Q24H Timothy Timothy [name], Timothy A, MD      . fentaNYL (SUBLIMAZE) injection 25 mcg  25 mcg Intravenous Q4H PRN Timothy Timothy [name], [name], MD      . hydrALAZINE (APRESOLINE) injection 10 mg  10 mg Intravenous Q6H PRN Timothy Timothy [name], Timothy A, MD      . ketorolac (TORADOL) 30 MG/ML injection 15 mg  15 mg Intravenous Q6H PRN Timothy Timothy [name], Timothy A, MD      . magnesium hydroxide (MILK OF MAGNESIA) suspension 30 mL  30 mL Oral Daily PRN Timothy Timothy [name], Timothy A, MD      . metoCLOPramide (REGLAN) injection 10 mg  10 mg Intravenous Q8H PRN Timothy Timothy [name], Timothy A, MD      . ondansetron [facility]) injection 4 mg  4 mg Intravenous Q6H PRN Timothy Timothy [name], Timothy A, MD      . sodium chloride flush (NS) 0.9 % injection 3 mL  3 mL Intravenous Q12H Timothy Timothy [name], Timothy A, MD      . traZODone (DESYREL) tablet 25 mg  25 mg Oral QHS PRN Timothy Timothy [name], Timothy Honey, MD       Past Medical History:  Diagnosis Date  . Coronary artery disease   . Hypertension    Social History Social History   Tobacco Use  . Smoking status: Not on file  Substance Use Topics  . Alcohol use: Not on file  . Drug use: Not on file   Family History No family history on file.  He denies  family history of peripheral artery disease, aneurysmal disease or bleeding/clotting disorders.  Allergies  Allergen Reactions  . Morphine And Related Rash   REVIEW OF SYSTEMS (Negative unless checked)  Constitutional: [] Weight loss  [] Fever  [] Chills Cardiac: [] Chest pain   [] Chest pressure   [] Palpitations   [] Shortness of breath when laying flat   [] Shortness of breath at rest   [] Shortness of breath with exertion. Vascular:  [] Pain in legs with walking   [] Pain in legs at rest   [] Pain in legs when laying flat   [] Claudication   [] Pain in feet when walking  [] Pain in feet at rest  [] Pain in feet when laying flat   [] History of DVT   [] Phlebitis   [] Swelling in legs   [] Varicose veins   [] Non-healing ulcers Pulmonary:   [] Uses home oxygen   [] Productive cough   [] Hemoptysis   [] Wheeze  [] COPD   [] Asthma Neurologic:  [x] Dizziness  [x] Blackouts   [] Seizures   [] History of stroke   [] History of TIA  [] Aphasia   [] Temporary blindness   [] Dysphagia   [] Weakness or numbness in arms   [] Weakness or numbness in legs Musculoskeletal:  [] Arthritis   [] Joint swelling   [] Joint pain   [] Low back pain Hematologic:  [] Easy bruising  [] Easy bleeding   [] Hypercoagulable state   [] Anemic  [] Hepatitis Gastrointestinal:  [] Blood in stool   [] Vomiting blood  [] Gastroesophageal reflux/heartburn   [] Difficulty swallowing. Genitourinary:  [] Chronic kidney disease   [] Difficult urination  [] Frequent urination  [] Burning with urination   [] Blood in urine Skin:  [] Rashes   [] Ulcers   [] Wounds Psychological:  [] History of anxiety   []  History of major depression.  Physical Examination  Vitals:   07/20/18 0758 07/20/18 0906 07/20/18 0908 07/20/18 0910  BP: (!) 141/66 121/70 132/76 117/74  Pulse: (!) 57 (!) 54 (!) 56 62  Resp:      Temp: (!) 97.5 F (36.4 C)     TempSrc: Oral     SpO2: 96%     Weight:      Height:       Body mass index is 29.09 kg/m. Gen:  WD/WN, NAD Head: [place]/AT, No temporalis wasting.  Prominent temp pulse not noted. Ear/Nose/Throat: Hearing grossly intact, nares w/o erythema or drainage, oropharynx w/o Erythema/Exudate Eyes: Sclera non-icteric, conjunctiva clear Neck: Trachea midline.  No JVD. Right Carotid Bruit noted. Pulmonary:  Good air movement, respirations not labored, equal bilaterally.  Cardiac: RRR, normal S1, S2. Vascular:  Vessel Right Left  Radial Palpable Palpable  Ulnar Palpable Palpable  Brachial Palpable Palpable  Carotid Palpable, without bruit Palpable, without bruit  Aorta Not palpable N/Timothy [name]  Femoral Palpable Palpable  Popliteal Palpable Palpable  PT Palpable Palpable  DP Palpable Palpable   Gastrointestinal: soft, non-tender/non-distended. No guarding/reflex. No pulsitle mass noted.  Musculoskeletal: M/S 5/5 throughout.  Extremities without ischemic changes.  No deformity or atrophy. No edema. Neurologic: Sensation grossly intact in extremities.  Symmetrical.  Speech is fluent. Motor exam as listed above. Psychiatric: Judgment intact, Mood & affect appropriate for pt's clinical situation. Dermatologic: No rashes or ulcers noted.  No cellulitis or open wounds. Lymph : No Cervical, Axillary, or Inguinal lymphadenopathy.  CBC Lab Results  Component Value Date   WBC 7.0 07/20/2018   HGB 13.8 07/20/2018   HCT 43.5 07/20/2018   MCV 85.6 07/20/2018   PLT 182 07/20/2018   BMET    Component Value Date/Time   NA 139 07/20/2018 0436   K 3.9 07/20/2018 0436   CL 105 07/20/2018 0436   CO2 25 07/20/2018 0436   GLUCOSE 161 (H) 07/20/2018 0436   BUN 20 07/20/2018 0436   CREATININE 1.11 07/20/2018 0436   CALCIUM 8.7 (L) 07/20/2018 0436   GFRNONAA >60 07/20/2018 0436   GFRAA >60 07/20/2018 0436   Estimated Creatinine Clearance: 54.8 mL/min (by C-G formula based on SCr of 1.11 mg/dL).  COAG No results found for: INR, PROTIME  Radiology US Carotid Bilateral  Result Date: 07/20/2018 CLINICAL DATA:  Syncope EXAM: BILATERAL CAROTID DUPLEX  ULTRASOUND TECHNIQUE: Wallace Cullens scale imaging, color Doppler and duplex ultrasound were performed of bilateral carotid and vertebral arteries in the neck. COMPARISON:  None. FINDINGS: Criteria: Quantification of carotid stenosis is based on velocity parameters that correlate the residual internal carotid diameter with NASCET-based stenosis levels, using the diameter of the distal internal carotid lumen as the denominator for stenosis measurement. The following velocity measurements were obtained: RIGHT ICA: 222 cm/sec CCA: 100 cm/sec SYSTOLIC ICA/CCA RATIO:  2.2 ECA: 89 cm/sec LEFT ICA: Occluded cm/sec CCA: 59 cm/sec SYSTOLIC ICA/CCA RATIO:  Occluded ECA: 126 cm/sec RIGHT CAROTID ARTERY: Mild smooth mixed plaque in the upper common carotid artery. Mild focal calcified plaque in the bulb. Low resistance internal carotid Doppler pattern is preserved. There is circumferential mixed plaque in the proximal internal carotid artery causing narrowing visualized on color Doppler imaging. RIGHT VERTEBRAL ARTERY: Antegrade. Right subclavian artery Doppler pattern is triphasic. LEFT CAROTID ARTERY: There is extensive calcified plaque in the bulb. The internal carotid artery is occluded at the level of the bulb. LEFT VERTEBRAL ARTERY: Antegrade. Left subclavian artery Doppler pattern is triphasic. 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 Bilateral vertebral arteries are patent with antegrade flow. Electronically Signed   By: Jolaine Click M.D.   On: 07/20/2018 10:30   Ct Angio Abd/pel W/ And/or W/o  Result Date: 07/20/2018 CLINICAL DATA:  Generalized abdominal pain with syncope. History of abdominal aortic aneurysm. EXAM: CTA ABDOMEN AND PELVIS wITHOUT AND WITH CONTRAST TECHNIQUE: Multidetector CT imaging of the abdomen and pelvis was performed using the standard protocol during bolus administration of intravenous contrast. Multiplanar reconstructed images and MIPs  were obtained and reviewed to evaluate the vascular anatomy. CONTRAST:  ISOVUE-370 IOPAMIDOL (ISOVUE-370) INJECTION 76% COMPARISON:  Abdominal CT report from [facility] 02/10/2017 FINDINGS: VASCULAR Aorta: Atheromatous calcification with mural thrombus throughout the abdominal aorta. Fusiform infrarenal abdominal aortic aneurysm measuring up to 3.3 cm, 1 mm larger than in 2018. No intimal calcification disruption or visible intramural hematoma. There is mild fat stranding along the anterior aneurysm seen at the level of the IMA.  No aortic draping. Celiac: Plaque at the origin without stenosis.  No branch occlusion. SMA: Diffusely patent. No branch occlusion or flow limiting stenosis Renals: Accessory lower pole renal arteries, stenotic at the right lower pole with associated atrophy. No emergent finding. IMA: Patent Inflow: Diffuse irregular atherosclerotic plaque without flow limiting stenosis. Left common iliac artery measures up to 17 mm in diameter. Proximal Outflow: Atheromatous plaque without flow limiting stenosis. Veins: Unremarkable Review of the MIP images confirms the above findings. NON-VASCULAR Lower chest: Bilateral hilar/bronchial adenopathy, not described on Timothy [name] chest CT report at [facility] 08/11/2015. Paraseptal emphysema and atelectasis or scarring. Extensive coronary calcification. Hiatal hernia. Hepatobiliary: Small low-density in the upper left lobe liver, too small for densitometry. Thickening of the gallbladder fundus with central low-density, correlating with outside abdominal CT report of adenomyomatosis. Pancreas: Negative Spleen: Negative Adrenals/Urinary Tract: Bilateral renal cystic densities. Negative adrenals. No hydronephrosis or ureteral calculus. Negative urinary bladder. Stomach/Bowel: Negative for obstruction or visible inflammation. Extensive colonic diverticulosis. Lymphatic: Negative for mass or definite adenopathy. Reproductive: Symmetric enlargement of the prostate Other: No ascites  or pneumoperitoneum.  Left inguinal hernia. Musculoskeletal: Generalized spinal degeneration. No acute or aggressive finding. IMPRESSION: VASCULAR 1. Abdominal aortic aneurysm measuring up to 3.3 cm, similar in size to outside CT report 02/10/2017. No aortic rupture. The aneurysm may still be symptomatic as there is mild fat haziness along the sac near the IMA origin. 2. Extensive atherosclerotic plaque. 3. Stenosis of Timothy [name] accessory right lower pole renal artery with associated atrophy. NON-VASCULAR 1. Hilar/bronchial adenopathy partially visualized. This was not reported on Timothy [name] 2017 outside chest CT, recommend non emergent follow-up chest CT. 2.  Multiple chronic findings are described above. Electronically Signed   By: Marnee Spring M.D.   On: 07/20/2018 05:39   Assessment/Plan The patient is an 85 year old male with Timothy [name] past medical history of hypertension, coronary artery disease, AAA, ongoing tobacco abuse who presented to the [facility] emergency department early this morning after two episodes of syncope 1. AAA: Patient with known history of abdominal aortic aneurysm.  When compared to imaging in 2018 there is no notable growth.  Currently measuring 3.3cm.  Reviewed imaging with Dr. Wyn Quaker.  There is no evidence of rupture or dissection.  No stranding noted.  Patient is denying any abdominal pain.  With the absence of symptoms and stable aneurysm there is no indication for intervention at this time.  Discussed how continued tobacco abuse is Timothy [name] huge risk factor in the growth of aneurysms.  Hypertension control seems to be improved this morning.  Uncontrolled hypertension is also Timothy [name] risk factor in aneurysmal growth.  We will see the patient in the outpatient setting to follow this.  The patient is in agreement. 2. Carotid Stenosis: During his work-up for syncope Timothy [name] bilateral carotid ultrasound was ordered.  Duplex was notable for left internal carotid artery occlusion with possible 50-69%  stenosis of the right internal carotid artery.  This seems to be Timothy [name] new diagnosis as the patient has not undergone Timothy [name] carotid duplex before.  With the exception of syncope the patient denies any other neurologic symptoms.  Will order Timothy [name] CTA of the neck tomorrow to allow for 24 hours between contrast administration to assess his anatomy and exact degree of stenosis. 3. Tobacco Abuse: We had Timothy [name] discussion for approximately five minutes regarding the absolute need for smoking cessation due to the deleterious nature of tobacco on the vascular system. We discussed the tobacco use would diminish patency of any  intervention, and likely significantly worsen progression of disease. We discussed multiple agents for quitting including replacement therapy or medications to reduce cravings such as Chantix. The patient voices their understanding of the importance of smoking cessation. 4.  Atherosclerotic disease: Currently the patient is on aspirin and statin for medical management.  Discussed with Dr. Weldon Inches, PA-C  07/20/2018 11:16 AM  This Timothy [name] was created with Dragon medical transcription system.  Any error is purely unintentional.

## 2018-07-20 NOTE — Care Management Obs Status (Signed)
MEDICARE OBSERVATION STATUS NOTIFICATION   Patient Details  Name: Timothy [name] MRN: [id number] Date of Birth: 08/07/1931   Medicare Observation Status Notification Given:  Yes    Corey Harold 07/20/2018, 3:58 PM

## 2018-07-20 NOTE — Progress Notes (Signed)
*  PRELIMINARY RESULTS* Echocardiogram 2D Echocardiogram has been performed.  Timothy [name] 07/20/2018, 1:55 PM

## 2018-07-20 NOTE — H&P (Signed)
[facility] [place] [facility]   PATIENT NAME: Timothy [name]    MR#:  [id number]  DATE OF BIRTH:  07-17-1931  DATE OF ADMISSION:  07/20/2018  PRIMARY CARE PHYSICIAN: System, Pcp Not In   REQUESTING/REFERRING PHYSICIAN: Bayard Males, MD CHIEF COMPLAINT:   Chief Complaint  Patient presents with  . Loss of Consciousness  . Abdominal Pain    HISTORY OF PRESENT ILLNESS:  Timothy [name]  is a 85 y.o. male with a known history of coronary artery disease and hypertension as well as ongoing tobacco abuse who presented to the emergency room with acute onset of pain when he went to the kitchen to get water without preceding headache or dizziness or chest pain or palpitations.  He denied any injuries.  He had nausea and vomiting once prior to that he was having lower abdominal pain without diarrhea.  He denied any melena or bright red bleeding per rectum.  No bilious vomitus or hematemesis.  Cough or wheezing or hemoptysis.  He denied any palpitations.  No paresthesias or focal muscle weakness, tinnitus or vertigo.  Upon presentation to the emergency room, his blood pressure was initially elevated at 186/76 with a pulse of 62 respiratory 17 pulse currently was 95% on room air.  His labs including CBC and CMP were essentially unremarkable and his troponin I was less than 0.03.  Serum lipase was normal.  His EKG showed pulseless bigeminy with a rate of 78 with right axis deviation.  Abdominal pelvic CT angiography revealed hilar/bronchial adenopathy that is partially visualized with recommendation for emergent CT.  It showed multiple chronic findings and did show abdominal aortic aneurysm measuring up to 3.3 cm similar in size to a CT on 02/10/2017.  There was mild fat haziness along the sac near the IMA origin.  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  The patient was given 50 mcg of IV fentanyl  and 10 mg of IV Reglan as well as hypertension Nitropaste for his elevated blood pressure.  He was fairly comfortable during my interview  He will be admitted to an observation medically monitored bed for further evaluation and monitoring. PAST MEDICAL HISTORY:   Past Medical History:  Diagnosis Date  . Coronary artery disease   . Hypertension   .  Ongoing tobacco abuse  PAST SURGICAL HI none STORY:  None  SOCIAL HISTORY:  He smokes 3 to 4 cigarettes/day.  He denied any history of alcohol abuse or illicit drug use.  FAMILY HISTORY:   He denied any familial diseases  DRUG ALLERGIES:   Allergies  Allergen Reactions  . Morphine And Related Rash    REVIEW OF SYSTEMS:   ROS As per history of present illness. All pertinent systems were reviewed above. Constitutional,  HEENT, cardiovascular, respiratory, GI, GU, musculoskeletal, neuro, psychiatric, endocrine,  integumentary and hematologic systems were reviewed and are otherwise  negative/unremarkable except for positive findings mentioned above in the HPI.   MEDICATIONS AT HOME:   Prior to Admission medications   Not on File      VITAL SIGNS:  Blood pressure (!) 150/94, pulse (!) 56, temperature 98.6 F (37 C), temperature source Oral, resp. rate 15, height  (1.753 m), weight 89.4 kg, SpO2 94 %.  PHYSICAL EXAMINATION:  Physical Exam  GENERAL:  85 y.o.-year-old pleasant African-American male patient lying in the bed with no acute distress.  EYES: Pupils equal, round, reactive to  light and accommodation. No scleral icterus. Extraocular muscles intact.  HEENT: Head atraumatic, normocephalic. Oropharynx with mildly dry tongue and moist mucous membrane and nasopharynx clear.  NECK:  Supple, no jugular venous distention. No thyroid enlargement, no tenderness.  LUNGS: Normal breath sounds bilaterally, no wheezing, rales,rhonchi or crepitation. No use of accessory muscles of respiration.  CARDIOVASCULAR: S1, S2 normal.  No murmurs, rubs, or gallops.  ABDOMEN: Soft, nontender, nondistended. Bowel sounds present. No organomegaly or mass.  EXTREMITIES: No pedal edema, cyanosis, or clubbing.  NEUROLOGIC: Cranial nerves II through XII are intact. Muscle strength 5/5 in all extremities. Sensation intact. Gait not checked.  PSYCHIATRIC: The patient is alert and oriented x 3.  SKIN: No obvious rash, lesion, or ulcer.   LABORATORY PANEL:   CBC Recent Labs  Lab 07/20/18 0436  WBC 7.0  HGB 13.8  HCT 43.5  PLT 182   ------------------------------------------------------------------------------------------------------------------  Chemistries  Recent Labs  Lab 07/20/18 0436  NA 139  K 3.9  CL 105  CO2 25  GLUCOSE 161*  BUN 20  CREATININE 1.11  CALCIUM 8.7*  AST 17  ALT 14  ALKPHOS 61  BILITOT 0.5   ------------------------------------------------------------------------------------------------------------------  Cardiac Enzymes Recent Labs  Lab 07/20/18 0436  TROPONINI <0.03   ------------------------------------------------------------------------------------------------------------------  RADIOLOGY:  Ct Angio Abd/pel W/ And/or W/o  Result Date: 07/20/2018 CLINICAL DATA:  Generalized abdominal pain with syncope. History of abdominal aortic aneurysm. EXAM: CTA ABDOMEN AND PELVIS wITHOUT AND WITH CONTRAST TECHNIQUE: Multidetector CT imaging of the abdomen and pelvis was performed using the standard protocol during bolus administration of intravenous contrast. Multiplanar reconstructed images and MIPs were obtained and reviewed to evaluate the vascular anatomy. CONTRAST:  ISOVUE-370 IOPAMIDOL (ISOVUE-370) INJECTION 76% COMPARISON:  Abdominal CT report from [facility] 02/10/2017 FINDINGS: VASCULAR Aorta: Atheromatous calcification with mural thrombus throughout the abdominal aorta. Fusiform infrarenal abdominal aortic aneurysm measuring up to 3.3 cm, 1 mm larger than in 2018. No intimal  calcification disruption or visible intramural hematoma. There is mild fat stranding along the anterior aneurysm seen at the level of the IMA. No aortic draping. Celiac: Plaque at the origin without stenosis.  No branch occlusion. SMA: Diffusely patent. No branch occlusion or flow limiting stenosis Renals: Accessory lower pole renal arteries, stenotic at the right lower pole with associated atrophy. No emergent finding. IMA: Patent Inflow: Diffuse irregular atherosclerotic plaque without flow limiting stenosis. Left common iliac artery measures up to 17 mm in diameter. Proximal Outflow: Atheromatous plaque without flow limiting stenosis. Veins: Unremarkable Review of the MIP images confirms the above findings. NON-VASCULAR Lower chest: Bilateral hilar/bronchial adenopathy, not described on a chest CT report at [facility] 08/11/2015. Paraseptal emphysema and atelectasis or scarring. Extensive coronary calcification. Hiatal hernia. Hepatobiliary: Small low-density in the upper left lobe liver, too small for densitometry. Thickening of the gallbladder fundus with central low-density, correlating with outside abdominal CT report of adenomyomatosis. Pancreas: Negative Spleen: Negative Adrenals/Urinary Tract: Bilateral renal cystic densities. Negative adrenals. No hydronephrosis or ureteral calculus. Negative urinary bladder. Stomach/Bowel: Negative for obstruction or visible inflammation. Extensive colonic diverticulosis. Lymphatic: Negative for mass or definite adenopathy. Reproductive: Symmetric enlargement of the prostate Other: No ascites or pneumoperitoneum.  Left inguinal hernia. Musculoskeletal: Generalized spinal degeneration. No acute or aggressive finding. IMPRESSION: VASCULAR 1. Abdominal aortic aneurysm measuring up to 3.3 cm, similar in size to outside CT report 02/10/2017. No aortic rupture. The aneurysm may still be symptomatic as there is mild fat haziness along the sac near the IMA origin. 2. Extensive  atherosclerotic plaque. 3. 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 NON-VASCULAR 1. Hilar/bronchial adenopathy partially visualized. This was not reported on a 2017 outside chest CT, recommend non emergent follow-up chest CT. 2.  Multiple chronic findings are described above. Electronically Signed   By: Marnee Spring M.D.   On: 07/20/2018 05:39      IMPRESSION AND PLAN:   #1.  Syncope: The patient will be admitted to an observation medically monitored bed.  Will check orthostatics q 12 hours.  Will obtain a bilateral carotid Doppler and 2D echo.  The patient will be gently hydrated with IV normal saline and monitored for arrhythmias. Differential diagnoses would include neurally mediated syncope, cardiogenic, arrhythmias related,  orthostatic hypotension and less likely hypoglycemia.    2.  Hypertensive urgency.  His blood pressure is currently better.  Pending reconciliation of his medications we will place him on PRN IV hydralazine.  3.  Pulsus bigeminy in the setting of syncope.  The echo will be obtained and a cardiology consultation today.  His potassium was normal.  I contacted Dr. Gwen Pounds with the [facility].  4.  Abdominal pain.  He had associated nausea and vomiting.  No acute findings on abdominal and pelvic CT scan.  He will be placed on pain management and PRN antiemetics.  He could be having mild acute viral gastritis.  No diarrhea to suggest enteritis.  5.  Abdominal aortic aneurysm.  This is currently only 3.3 cm.  He can follow-up with the vascular surgeon on outpatient basis.  6.  Coronary artery disease.  He did not have any chest pain.  7.  DVT prophylaxis.  Subcutaneous Lovenox  8.  GI prophylaxis.  Given his nausea and vomiting he will be placed on IV Protonix.  All the records are reviewed and case discussed with ED provider. Management plans discussed with the patient, all questions were answered and he is in agreement.    CODE STATUS: Full code  TOTAL TIME TAKING CARE OF THIS PATIENT: 50 minutes minutes.    Hannah Beat M.D on 07/20/2018 at 6:34 AM  Pager - [phone]  After 6pm go to [email or web address] - Social research officer, government  [facility]  Office  [phone]  CC: Primary care physician; System, Pcp Not In   Note: This dictation was prepared with Dragon dictation along with smaller phrase technology. Any transcriptional errors that result from this process are unintentional.
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
## 2018-07-21 ENCOUNTER — Observation Stay: Payer: Medicare Other

## 2018-07-21 DIAGNOSIS — R55 Syncope and collapse: Secondary | ICD-10-CM | POA: Diagnosis not present

## 2018-07-21 DIAGNOSIS — K529 Noninfective gastroenteritis and colitis, unspecified: Secondary | ICD-10-CM | POA: Diagnosis not present

## 2018-07-21 DIAGNOSIS — I25118 Atherosclerotic heart disease of native coronary artery with other forms of angina pectoris: Secondary | ICD-10-CM | POA: Diagnosis not present

## 2018-07-21 DIAGNOSIS — F17200 Nicotine dependence, unspecified, uncomplicated: Secondary | ICD-10-CM | POA: Diagnosis not present

## 2018-07-21 DIAGNOSIS — I6523 Occlusion and stenosis of bilateral carotid arteries: Secondary | ICD-10-CM | POA: Diagnosis not present

## 2018-07-21 DIAGNOSIS — I493 Ventricular premature depolarization: Secondary | ICD-10-CM | POA: Diagnosis not present

## 2018-07-21 DIAGNOSIS — I714 Abdominal aortic aneurysm, without rupture: Secondary | ICD-10-CM | POA: Diagnosis not present

## 2018-07-21 LAB — CBC
HCT: 42.2 % (ref 39.0–52.0)
Hemoglobin: 13.2 g/dL (ref 13.0–17.0)
MCH: 27 pg (ref 26.0–34.0)
MCHC: 31.3 g/dL (ref 30.0–36.0)
MCV: 86.5 fL (ref 80.0–100.0)
Platelets: 170 10*3/uL (ref 150–400)
RBC: 4.88 MIL/uL (ref 4.22–5.81)
RDW: 14.6 % (ref 11.5–15.5)
WBC: 6.4 10*3/uL (ref 4.0–10.5)
nRBC: 0 % (ref 0.0–0.2)

## 2018-07-21 LAB — BASIC METABOLIC PANEL
Anion gap: 9 (ref 5–15)
BUN: 15 mg/dL (ref 8–23)
CO2: 21 mmol/L — ABNORMAL LOW (ref 22–32)
Calcium: 8.6 mg/dL — ABNORMAL LOW (ref 8.9–10.3)
Chloride: 110 mmol/L (ref 98–111)
Creatinine, Ser: 0.95 mg/dL (ref 0.61–1.24)
GFR calc Af Amer: >60 mL/min (ref >60)
GFR calc non Af Amer: >60 mL/min (ref >60)
GLUCOSE: 122 mg/dL — AB (ref 70–99)
Potassium: 4.1 mmol/L (ref 3.5–5.1)
Sodium: 140 mmol/L (ref 135–145)

## 2018-07-21 LAB — MAGNESIUM: Magnesium: 2 mg/dL (ref 1.7–2.4)

## 2018-07-21 MED ORDER — ATORVASTATIN CALCIUM 20 MG PO TABS: 80.0000 mg | ORAL_TABLET | Freq: Every evening | ORAL | Status: DC

## 2018-07-21 MED ORDER — LISINOPRIL 20 MG PO TABS
20.0000 mg | ORAL_TABLET | Freq: Every day | ORAL | Status: DC
  Administered 2018-07-21: 20 mg via ORAL
  Filled 2018-07-21: qty 1

## 2018-07-21 MED ORDER — CLOPIDOGREL BISULFATE 75 MG PO TABS
75.0000 mg | ORAL_TABLET | Freq: Every day | ORAL | Status: DC
  Administered 2018-07-21: 75 mg via ORAL
  Filled 2018-07-21: qty 1

## 2018-07-21 MED ORDER — ASPIRIN EC 81 MG PO TBEC
81.0000 mg | DELAYED_RELEASE_TABLET | Freq: Every day | ORAL | Status: DC
  Administered 2018-07-21: 81 mg via ORAL
  Filled 2018-07-21: qty 1

## 2018-07-21 MED ORDER — FAMOTIDINE 20 MG PO TABS
20.0000 mg | ORAL_TABLET | Freq: Two times a day (BID) | ORAL | Status: DC
  Administered 2018-07-21: 20 mg via ORAL
  Filled 2018-07-21: qty 1

## 2018-07-21 MED ORDER — SPIRONOLACTONE 25 MG PO TABS
12.5000 mg | ORAL_TABLET | Freq: Every day | ORAL | Status: DC
  Administered 2018-07-21: 12.5 mg via ORAL
  Filled 2018-07-21: qty 0.5
  Filled 2018-07-21: qty 1

## 2018-07-21 MED ORDER — NITROGLYCERIN 0.4 MG SL SUBL: 0.4000 mg | SUBLINGUAL_TABLET | SUBLINGUAL | Status: DC | PRN

## 2018-07-21 MED ORDER — METOPROLOL SUCCINATE ER 25 MG PO TB24
25.0000 mg | ORAL_TABLET | Freq: Every day | ORAL | Status: DC
  Administered 2018-07-21: 25 mg via ORAL
  Filled 2018-07-21: qty 1

## 2018-07-21 MED ORDER — IOHEXOL 350 MG/ML SOLN
75.0000 mL | Freq: Once | INTRAVENOUS | Status: AC | PRN
  Administered 2018-07-21: 75 mL via INTRAVENOUS

## 2018-07-21 NOTE — TOC Transition Note (Signed)
Transition of Care [facility]) - CM/SW Discharge Note   Patient Details  Name: Timothy [name] MRN: [id number] Date of Birth: 05-Jan-1932  Transition of Care [facility]) CM/SW Contact:  Gwenette Greet, RN Phone Number: 07/21/2018, 12:36 PM   Clinical Narrative:   Discharge to home today per Dr. Enid Baas. No follow-up needs identified.  Family will transport     Final next level of care: Home/Self Care Barriers to Discharge: No Barriers Identified   Patient Goals and CMS Choice Patient states their goals for this hospitalization and ongoing recovery are:: (Wants to go home) CMS Medicare.gov Compare Post Acute Care list provided to:: (NA) Choice offered to / list presented to : NA  Discharge Placement home                       Discharge Plan and Services In-house Referral: NA Discharge Planning Services: CM Consult Post Acute Care Choice: NA          DME Arranged: (None needed) DME Agency: NA HH Arranged: NA     Social Determinants of Health (SDOH) Interventions none     Readmission Risk Interventions No flowsheet data found.

## 2018-07-21 NOTE — Progress Notes (Signed)
Patient refused to have CNA check CBG

## 2018-07-21 NOTE — TOC Initial Note (Signed)
Transition of Care [facility]) - Initial/Assessment Note    Patient Details  Name: Timothy [name] MRN: [id number] Date of Birth: 03/14/1932  Transition of Care [facility]) CM/SW Contact:    Timothy Greet, RN Phone Number: 07/21/2018, 10:58 AM  Clinical Narrative:   Admitted to [facility] under observation status with the diagnosis of syncope. Wife and daughter are in the home. Daughter is Timothy [name] [phone]). Uses [facility] for medications or [facility] in [place].  No home Health in the past, No skilled facility in the past. Rolling walker and cane in the home, if needed. Takes care of all basic activities of daily living himself, drives.   Larey Seat prior to this admission.              Expected Discharge Plan: Home/Self Care Barriers to Discharge: No Barriers Identified   Patient Goals and CMS Choice Patient states their goals for this hospitalization and ongoing recovery are:: (Wants to go home) CMS Medicare.gov Compare Post Acute Care list provided to:: (NA) Choice offered to / list presented to : NA  Expected Discharge Plan and Services Expected Discharge Plan: Home/Self Care In-house Referral: NA Discharge Planning Services: CM Consult Post Acute Care Choice: NA Living arrangements for the past 2 months: Single Family Home(Daughter and wife in the home)                 DME Arranged: (None needed) DME Agency: NA HH Arranged: NA    Prior Living Arrangements/Services Living arrangements for the past 2 months: Single Family Home(Daughter and wife in the home) Lives with:: Spouse, Adult Children Patient language and need for interpreter reviewed:: No Do you feel safe going back to the place where you live?: Yes      Need for Family Participation in Patient Care: No (Comment) Care giver support system in place?: No (comment) Current home services: (No services in the home) Criminal Activity/Legal Involvement Pertinent to Current Situation/Hospitalization: No - Comment  as needed  Activities of Daily Living Home Assistive Devices/Equipment: None ADL Screening (condition at time of admission) Patient's cognitive ability adequate to safely complete daily activities?: Yes Is the patient deaf or have difficulty hearing?: No Does the patient have difficulty seeing, even when wearing glasses/contacts?: No Does the patient have difficulty concentrating, remembering, or making decisions?: No Patient able to express need for assistance with ADLs?: No Does the patient have difficulty dressing or bathing?: No Independently performs ADLs?: Yes (appropriate for developmental age) Does the patient have difficulty walking or climbing stairs?: No Weakness of Legs: None Weakness of Arms/Hands: None  Permission Sought/Granted Permission sought to share information with : Case Manager Permission granted to share information with : Yes, Verbal Permission Granted              Emotional Assessment Appearance:: Appears stated age Attitude/Demeanor/Rapport: (plesant) Affect (typically observed): Accepting Orientation: : Oriented to Self, Oriented to Place, Oriented to  Time, Oriented to Situation Alcohol / Substance Use: Not Applicable Psych Involvement: No (comment)  Admission diagnosis:  Syncope [R55] Patient Active Problem List   Diagnosis Date Noted  . Syncope 07/20/2018   PCP:  System, Pcp Not In Pharmacy:   [id number] [place] - [address] AT [facility] OF [address] [place] [address] Phone: [phone] Fax: [phone]     Social Determinants of Health (SDOH) Interventions    Readmission Risk Interventions No flowsheet data found.

## 2018-07-21 NOTE — Care Management Obs Status (Signed)
MEDICARE OBSERVATION STATUS NOTIFICATION   Patient Details  Name: KESTON BIRDEN MRN: [id number] Date of Birth: 1931-10-24   Medicare Observation Status Notification Given:  Yes    Gwenette Greet, RN 07/21/2018, 10:53 AM

## 2018-07-21 NOTE — Progress Notes (Signed)
[facility] Daily Progress Note   Subjective: Patient with known AAA and newly diagnosed carotid stenosis. Both are asymptomatic at this time.   CTA NECK (07/21/18): Reviewed with Dr. Gilda Crease 1) Bovine Arch Type 2 2) Left ICA occlusion 3) Approximately 60& stenosis of the right ICA 4) Patent vertebrals  Objective: Vitals:   07/20/18 2104 07/20/18 2113 07/21/18 0500 07/21/18 0601  BP: (!) 152/77 (!) 146/85  (!) 162/80  Pulse: 62 61  (!) 59  Resp:    12  Temp:    98.5 F (36.9 C)  TempSrc:    Oral  SpO2: 95% 94%  96%  Weight:   87.8 kg   Height:        Intake/Output Summary (Last 24 hours) at 07/21/2018 1043 Last data filed at 07/21/2018 0900 Gross per 24 hour  Intake 2473.23 ml  Output 2080 ml  Net 393.23 ml   Physical Exam: A&Ox3, NAD Neck: Right Carotid Bruit Noted CV: RRR Pulmonary: CTA Bilaterally Abdomen: Soft, Nontender, Nondistended, (+) Bowel Sounds, no pulsatile mass noted Vascular: warm, non-tender, minimal edema   Laboratory: CBC    Component Value Date/Time   WBC 6.4 07/21/2018 0458   HGB 13.2 07/21/2018 0458   HCT 42.2 07/21/2018 0458   PLT 170 07/21/2018 0458   BMET    Component Value Date/Time   NA 140 07/21/2018 0458   K 4.1 07/21/2018 0458   CL 110 07/21/2018 0458   CO2 21 (L) 07/21/2018 0458   GLUCOSE 122 (H) 07/21/2018 0458   BUN 15 07/21/2018 0458   CREATININE 0.95 07/21/2018 0458   CALCIUM 8.6 (L) 07/21/2018 0458   GFRNONAA >60 07/21/2018 0458   GFRAA >60 07/21/2018 0458   Assessment/Planning: The patient is an 85 year old year old male with a known AAA and newly diagnosed carotid artery stenosis 1) AAA - continues to be asymptomatic. No growth since 2018. Patient states his cardiologist at [facility] follows this. We would be happy to follow him as an outpatient as well. 2) Carotid stenosis: Low probability is cause of syncope. Discussed pathophysiology behind carotid artery disease, why continued surveillance is  important and risk of stroke.  We will see the patient back in our office in 1 month if he chooses to continue his outpatient care with our practice.  3) We had a discussion for approximately five minutes regarding the absolute need for smoking cessation due to the deleterious nature of tobacco on the vascular system. We discussed the tobacco use would diminish patency of any intervention, and likely significantly worsen progressio of disease. We discussed multiple agents for quitting including replacement therapy or medications to reduce cravings such as Chantix. The patient voices their understanding of the importance of smoking cessation. 4) Continue ASA, Statin and Plavix for medical management  Discussed with Dr. Charlie Pitter Dimarco Minkin PA-C 07/21/2018 10:43 AM

## 2018-07-21 NOTE — Discharge Summary (Signed)
[facility] [place] [facility]   PATIENT NAME: Timothy [name]    MR#:  [id number]  DATE OF BIRTH:  Mar 30, 1932  DATE OF ADMISSION:  07/20/2018   ADMITTING PHYSICIAN: Houston Siren, MD  DATE OF DISCHARGE: 07/21/2018  PRIMARY CARE PHYSICIAN: System, Pcp Not In   ADMISSION DIAGNOSIS:  Syncope [R55] DISCHARGE DIAGNOSIS:  Active Problems:   Syncope  SECONDARY DIAGNOSIS:   Past Medical History:  Diagnosis Date   Coronary artery disease    Hypertension    HOSPITAL COURSE:   Chief complaint; LOC   HPI Timothy [name]  is a 85 y.o. male with a known history of coronary artery disease and hypertension as well as ongoing tobacco abuse who presented to the emergency room with acute onset of pain when he went to the kitchen to get water without preceding headache or dizziness or chest pain or palpitations.  He denied any injuries.  Patient reported to have had a syncopal episode.  Admitted to medical service for further evaluation and management.  Hospital course;  1. Syncope: Syncope according the setting of severe abdominal pains with nausea and vomiting.  Seen by cardiologist during this admission.  Felt to be likely vasovagal syncope. Denied any chest pain.  No shortness of breath.   Orthostatic vitals checked with evidence of orthostatic hypotension.  Adequately hydrated with IV fluids..  Carotid Doppler ultrasound done revealed 50-69% stenosis in the right internal carotid artery. Left internal carotid artery is occluded .  Patient seen by vascular surgery.  Subsequently had CTA neck done today which revealed 60% stenosis of the right internal carotid artery and occlusion of the left.  Vascular surgery felt carotid stenosis very low probability as cause of syncope.  Recommendation is to have patient follow-up with vascular surgery in 1 month for further evaluation.  Continue aspirin and statin and Plavix. 2D echocardiogram done with ejection fraction of 60  to 65%.  Cavity size normal.  2. Hypertensive urgency. Blood pressure fairly controlled .  Resumed home dose of metoprolol and lisinopril.  Monitor.   3. Pulsus bigeminy in the setting of syncope. Seen by cardiologist.    Appreciate input.  2D echocardiogram with normal ejection fraction.   No further cardiac work-up recommended at this time.  Follow-up with [facility] cardiology post discharge from the hospital.  4. Abdominal pain.He had associated nausea and vomiting.  Completely resolved.  Symptoms likely secondary to acute gastritis. No acute findings on abdominal and pelvic CT scan but revealed hilar/bronchial adenopathy partially visualized. This was not reported on a 2017 outside chest CT, recommend non emergent follow-up chest CT.  5. Abdominal aortic aneurysm.  Abdominal aortic aneurysm measuring up to 3.3 cm, similar in size to outside CT report 02/10/2017. Patient seen by vascular surgery service.  No plans for any emergency intervention.  Remained stable.  To follow-up with vascular surgery as outpatient.  6. Coronary artery disease.  Stable he did not have any chest pain.    DISCHARGE CONDITIONS:  Stable CONSULTS OBTAINED:  Treatment Team:  Antonieta Iba, MD DRUG ALLERGIES:   Allergies  Allergen Reactions   Morphine And Related Rash   DISCHARGE MEDICATIONS:   Allergies as of 07/21/2018      Reactions   Morphine And Related Rash      Medication List    TAKE these medications   aspirin 81 MG EC tablet Take 81 mg by mouth daily.   atorvastatin 80 MG tablet Commonly known as:  LIPITOR Take 80 mg by mouth every evening.   clopidogrel 75 MG tablet Commonly known as:  PLAVIX Take 75 mg by mouth daily.   famotidine 20 MG tablet Commonly known as:  PEPCID Take 20 mg by mouth 2 (two) times daily.   furosemide 20 MG tablet Commonly known as:  LASIX Take 20 mg by mouth daily.   lisinopril 20 MG tablet Commonly known as:   PRINIVIL,ZESTRIL Take 20 mg by mouth daily.   metoprolol succinate 25 MG 24 hr tablet Commonly known as:  TOPROL-XL Take 25 mg by mouth daily.   Nitrostat 0.4 MG SL tablet Generic drug:  nitroGLYCERIN Place 0.4 mg under the tongue every 5 (five) minutes as needed for chest pain.   spironolactone 25 MG tablet Commonly known as:  ALDACTONE Take 12.5 mg by mouth daily.        DISCHARGE INSTRUCTIONS:   DIET:  Cardiac diet DISCHARGE CONDITION:  Stable ACTIVITY:  Activity as tolerated OXYGEN:  Home Oxygen: No.  Oxygen Delivery: room air DISCHARGE LOCATION:  home   If you experience worsening of your admission symptoms, develop shortness of breath, life threatening emergency, suicidal or homicidal thoughts you must seek medical attention immediately by calling 911 or calling your MD immediately  if symptoms less severe.  You Must read complete instructions/literature along with all the possible adverse reactions/side effects for all the Medicines you take and that have been prescribed to you. Take any new Medicines after you have completely understood and accpet all the possible adverse reactions/side effects.   Please note  You were cared for by a hospitalist during your hospital stay. If you have any questions about your discharge medications or the care you received while you were in the hospital after you are discharged, you can call the unit and asked to speak with the hospitalist on call if the hospitalist that took care of you is not available. Once you are discharged, your primary care physician will handle any further medical issues. Please note that NO REFILLS for any discharge medications will be authorized once you are discharged, as it is imperative that you return to your primary care physician (or establish a relationship with a primary care physician if you do not have one) for your aftercare needs so that they can reassess your need for medications and monitor your  lab values.    On the day of Discharge:  VITAL SIGNS:  Blood pressure (!) 145/83, pulse 65, temperature (!) 97.4 F (36.3 C), temperature source Oral, resp. rate 16, height 5\' 9"  (1.753 m), weight 87.8 kg, SpO2 97 %. PHYSICAL EXAMINATION:  GENERAL:  85 y.o.-year-old patient lying in the bed with no acute distress.  EYES: Pupils equal, round, reactive to light and accommodation. No scleral icterus. Extraocular muscles intact.  HEENT: Head atraumatic, normocephalic. Oropharynx and nasopharynx clear.  NECK:  Supple, no jugular venous distention. No thyroid enlargement, no tenderness.  LUNGS: Normal breath sounds bilaterally, no wheezing, rales,rhonchi or crepitation. No use of accessory muscles of respiration.  CARDIOVASCULAR: S1, S2 normal. No murmurs, rubs, or gallops.  ABDOMEN: Soft, non-tender, non-distended. Bowel sounds present. No organomegaly or mass.  EXTREMITIES: No pedal edema, cyanosis, or clubbing.  NEUROLOGIC: Cranial nerves II through XII are intact. Muscle strength 5/5 in all extremities. Sensation intact. Gait not checked.  PSYCHIATRIC: The patient is alert and oriented x 3.  SKIN: No obvious rash, lesion, or ulcer.  DATA REVIEW:   CBC Recent Labs  Lab 07/21/18 [phone]  WBC 6.4  HGB 13.2  HCT 42.2  PLT 170    Chemistries  Recent Labs  Lab 07/20/18 0436 07/21/18 0458  NA 139 140  K 3.9 4.1  CL 105 110  CO2 25 21*  GLUCOSE 161* 122*  BUN 20 15  CREATININE 1.11 0.95  CALCIUM 8.7* 8.6*  MG  --  2.0  AST 17  --   ALT 14  --   ALKPHOS 61  --   BILITOT 0.5  --      Microbiology Results  No results found for this or any previous visit.  RADIOLOGY:  Ct Angio Neck W Or Wo Contrast  Result Date: 07/21/2018 CLINICAL DATA:  Carotid stenosis.  Syncope. EXAM: CT ANGIOGRAPHY NECK TECHNIQUE: Multidetector CT imaging of the neck was performed using the standard protocol during bolus administration of intravenous contrast. Multiplanar CT image reconstructions and  MIPs were obtained to evaluate the vascular anatomy. Carotid stenosis measurements (when applicable) are obtained utilizing NASCET criteria, using the distal internal carotid diameter as the denominator. CONTRAST:  75mL OMNIPAQUE IOHEXOL 350 MG/ML SOLN COMPARISON:  Carotid ultrasound 07/20/2018 FINDINGS: Aortic arch: Mild atherosclerotic disease aortic arch without aneurysm or dissection. Proximal great vessels patent without significant stenosis. Right carotid system: Mild atherosclerotic plaque right common carotid artery. Calcified plaque right carotid bifurcation. 60% diameter stenosis proximal right internal carotid artery 15 mm above the bifurcation. External carotid artery widely patent Left carotid system: Mild atherosclerotic plaque left common carotid artery without stenosis. Occlusion of the left internal carotid artery at the origin. This remains occluded through the cavernous segment. External carotid artery widely patent on the left. Vertebral arteries: Mild stenosis origin of right vertebral artery. Right vertebral artery patent to the basilar. Left vertebral artery patent to the basilar without significant stenosis. Skeleton: No acute skeletal abnormality. Other neck: Negative for mass or adenopathy in the neck. Upper chest: Apical emphysema without acute abnormality. IMPRESSION: 60% diameter stenosis proximal right internal carotid artery Left internal carotid artery occluded at the origin through the cavernous segment. Both vertebral arteries patent to the basilar. Mild stenosis right vertebral artery origin. Electronically Signed   By: Marlan Palau M.D.   On: 07/21/2018 08:23     Management plans discussed with the patient, family and they are in agreement.  CODE STATUS: Full Code   TOTAL TIME TAKING CARE OF THIS PATIENT: 35 minutes.    Kerina Simoneau M.D on 07/21/2018 at 12:13 PM  Between 7am to 6pm - Pager - [phone]  After 6pm go to [email or web address] - Social research officer, government  [facility]  Office  [phone]  CC: Primary care physician; System, Pcp Not In   Note: This dictation was prepared with Dragon dictation along with smaller phrase technology. Any transcriptional errors that result from this process are unintentional.

## 2018-07-21 NOTE — Progress Notes (Signed)
Progress Note  Patient Name: Timothy AnconaLogan E [name] Date of Encounter: 07/21/2018  Primary Cardiologist: [name], MD   Subjective   No chest pain or SOB this AM. He has ambulated without difficulty this AM. He ate breakfast without nausea or emesis. No further episodes of syncope or pres-syncope reported this admission. He continues to report palpitations; however, he again stated this is not new for him. Noted will restart BB before discharge with elevated SBP.  No headaches or feeling of pounding HR with elevated BP.  Follow-up after discharge with [facility] and vascular surgery.   He reported he is eager to go home today.  Inpatient Medications    Scheduled Meds:  aspirin EC  81 mg Oral Daily   atorvastatin  80 mg Oral QPM   clopidogrel  75 mg Oral Daily   enoxaparin (LOVENOX) injection  40 mg Subcutaneous Q24H   famotidine  20 mg Oral BID   lisinopril  20 mg Oral Daily   metoprolol succinate  25 mg Oral Daily   sodium chloride flush  3 mL Intravenous Q12H   spironolactone  12.5 mg Oral Daily   Continuous Infusions:  sodium chloride 100 mL/hr at 07/21/18 0607   PRN Meds: acetaminophen **OR** acetaminophen, fentaNYL (SUBLIMAZE) injection, hydrALAZINE, ketorolac, magnesium hydroxide, metoCLOPramide (REGLAN) injection, nitroGLYCERIN, ondansetron (ZOFRAN) IV, traZODone   Vital Signs    Vitals:   07/20/18 2104 07/20/18 2113 07/21/18 0500 07/21/18 0601  BP: (!) 152/77 (!) 146/85  (!) 162/80  Pulse: 62 61  (!) 59  Resp:    12  Temp:    98.5 F (36.9 C)  TempSrc:    Oral  SpO2: 95% 94%  96%  Weight:   87.8 kg   Height:        Intake/Output Summary (Last 24 hours) at 07/21/2018 1016 Last data filed at 07/21/2018 0900 Gross per 24 hour  Intake 2473.23 ml  Output 2080 ml  Net 393.23 ml   Filed Weights   07/20/18 0430 07/21/18 0500  Weight: 89.4 kg 87.8 kg    Telemetry    Sinus bradycardia / sinus rhythm, frequent PVCs/ectopy - Personally  Reviewed  ECG    No new tracings - Personally Reviewed  Physical Exam   Per MD and to reduce exposure during COVID-19 pandemic  Labs    Chemistry Recent Labs  Lab 07/20/18 0436 07/21/18 0458  NA 139 140  K 3.9 4.1  CL 105 110  CO2 25 21*  GLUCOSE 161* 122*  BUN 20 15  CREATININE 1.11 0.95  CALCIUM 8.7* 8.6*  PROT 6.5  --   ALBUMIN 3.7  --   AST 17  --   ALT 14  --   ALKPHOS 61  --   BILITOT 0.5  --   GFRNONAA >60 >60  GFRAA >60 >60  ANIONGAP 9 9     Hematology Recent Labs  Lab 07/20/18 0436 07/21/18 0458  WBC 7.0 6.4  RBC 5.08 4.88  HGB 13.8 13.2  HCT 43.5 42.2  MCV 85.6 86.5  MCH 27.2 27.0  MCHC 31.7 31.3  RDW 14.7 14.6  PLT 182 170    Cardiac Enzymes Recent Labs  Lab 07/20/18 0436  TROPONINI <0.03   No results for input(s): TROPIPOC in the last 168 hours.   BNPNo results for input(s): BNP, PROBNP in the last 168 hours.   DDimer No results for input(s): DDIMER in the last 168 hours.   Radiology    Ct Angio Neck  W Or Wo Contrast  Result Date: 07/21/2018 CLINICAL DATA:  Carotid stenosis.  Syncope. EXAM: CT ANGIOGRAPHY NECK TECHNIQUE: Multidetector CT imaging of the neck was performed using the standard protocol during bolus administration of intravenous contrast. Multiplanar CT image reconstructions and MIPs were obtained to evaluate the vascular anatomy. Carotid stenosis measurements (when applicable) are obtained utilizing NASCET criteria, using the distal internal carotid diameter as the denominator. CONTRAST:  43mL OMNIPAQUE IOHEXOL 350 MG/ML SOLN COMPARISON:  Carotid ultrasound 07/20/2018 FINDINGS: Aortic arch: Mild atherosclerotic disease aortic arch without aneurysm or dissection. Proximal great vessels patent without significant stenosis. Right carotid system: Mild atherosclerotic plaque right common carotid artery. Calcified plaque right carotid bifurcation. 60% diameter stenosis proximal right internal carotid artery 15 mm above the  bifurcation. External carotid artery widely patent Left carotid system: Mild atherosclerotic plaque left common carotid artery without stenosis. Occlusion of the left internal carotid artery at the origin. This remains occluded through the cavernous segment. External carotid artery widely patent on the left. Vertebral arteries: Mild stenosis origin of right vertebral artery. Right vertebral artery patent to the basilar. Left vertebral artery patent to the basilar without significant stenosis. Skeleton: No acute skeletal abnormality. Other neck: Negative for mass or adenopathy in the neck. Upper chest: Apical emphysema without acute abnormality. IMPRESSION: 60% diameter stenosis proximal right internal carotid artery Left internal carotid artery occluded at the origin through the cavernous segment. Both vertebral arteries patent to the basilar. Mild stenosis right vertebral artery origin. Electronically Signed   By: Marlan Palau M.D.   On: 07/21/2018 08:23   US Carotid Bilateral  Result Date: 07/20/2018 CLINICAL DATA:  Syncope EXAM: BILATERAL CAROTID DUPLEX ULTRASOUND TECHNIQUE: Wallace Cullens scale imaging, color Doppler and duplex ultrasound were performed of bilateral carotid and vertebral arteries in the neck. COMPARISON:  None. FINDINGS: Criteria: Quantification of carotid stenosis is based on velocity parameters that correlate the residual internal carotid diameter with NASCET-based stenosis levels, using the diameter of the distal internal carotid lumen as the denominator for stenosis measurement. The following velocity measurements were obtained: RIGHT ICA: 222 cm/sec CCA: 100 cm/sec SYSTOLIC ICA/CCA RATIO:  2.2 ECA: 89 cm/sec LEFT ICA: Occluded cm/sec CCA: 59 cm/sec SYSTOLIC ICA/CCA RATIO:  Occluded ECA: 126 cm/sec RIGHT CAROTID ARTERY: Mild smooth mixed plaque in the upper common carotid artery. Mild focal calcified plaque in the bulb. Low resistance internal carotid Doppler pattern is preserved. There is  circumferential mixed plaque in the proximal internal carotid artery causing narrowing visualized on color Doppler imaging. RIGHT VERTEBRAL ARTERY: Antegrade. Right subclavian artery Doppler pattern is triphasic. LEFT CAROTID ARTERY: There is extensive calcified plaque in the bulb. The internal carotid artery is occluded at the level of the bulb. LEFT VERTEBRAL ARTERY: Antegrade. Left subclavian artery Doppler pattern is triphasic. 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 Bilateral vertebral arteries are patent with antegrade flow. Electronically Signed   By: Jolaine Click M.D.   On: 07/20/2018 10:30   Ct Angio Abd/pel W/ And/or W/o  Result Date: 07/20/2018 CLINICAL DATA:  Generalized abdominal pain with syncope. History of abdominal aortic aneurysm. EXAM: CTA ABDOMEN AND PELVIS wITHOUT AND WITH CONTRAST TECHNIQUE: Multidetector CT imaging of the abdomen and pelvis was performed using the standard protocol during bolus administration of intravenous contrast. Multiplanar reconstructed images and MIPs were obtained and reviewed to evaluate the vascular anatomy. CONTRAST:  ISOVUE-370 IOPAMIDOL (ISOVUE-370) INJECTION 76% COMPARISON:  Abdominal CT report from [facility] 02/10/2017 FINDINGS: VASCULAR Aorta:  Atheromatous calcification with mural thrombus throughout the abdominal aorta. Fusiform infrarenal abdominal aortic aneurysm measuring up to 3.3 cm, 1 mm larger than in 2018. No intimal calcification disruption or visible intramural hematoma. There is mild fat stranding along the anterior aneurysm seen at the level of the IMA. No aortic draping. Celiac: Plaque at the origin without stenosis.  No branch occlusion. SMA: Diffusely patent. No branch occlusion or flow limiting stenosis Renals: Accessory lower pole renal arteries, stenotic at the right lower pole with associated atrophy. No emergent finding. IMA: Patent Inflow: Diffuse irregular  atherosclerotic plaque without flow limiting stenosis. Left common iliac artery measures up to 17 mm in diameter. Proximal Outflow: Atheromatous plaque without flow limiting stenosis. Veins: Unremarkable Review of the MIP images confirms the above findings. NON-VASCULAR Lower chest: Bilateral hilar/bronchial adenopathy, not described on a chest CT report at [facility] 08/11/2015. Paraseptal emphysema and atelectasis or scarring. Extensive coronary calcification. Hiatal hernia. Hepatobiliary: Small low-density in the upper left lobe liver, too small for densitometry. Thickening of the gallbladder fundus with central low-density, correlating with outside abdominal CT report of adenomyomatosis. Pancreas: Negative Spleen: Negative Adrenals/Urinary Tract: Bilateral renal cystic densities. Negative adrenals. No hydronephrosis or ureteral calculus. Negative urinary bladder. Stomach/Bowel: Negative for obstruction or visible inflammation. Extensive colonic diverticulosis. Lymphatic: Negative for mass or definite adenopathy. Reproductive: Symmetric enlargement of the prostate Other: No ascites or pneumoperitoneum.  Left inguinal hernia. Musculoskeletal: Generalized spinal degeneration. No acute or aggressive finding. IMPRESSION: VASCULAR 1. Abdominal aortic aneurysm measuring up to 3.3 cm, similar in size to outside CT report 02/10/2017. No aortic rupture. The aneurysm may still be symptomatic as there is mild fat haziness along the sac near the IMA origin. 2. Extensive atherosclerotic plaque. 3. 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 NON-VASCULAR 1. Hilar/bronchial adenopathy partially visualized. This was not reported on a 2017 outside chest CT, recommend non emergent follow-up chest CT. 2.  Multiple chronic findings are described above. Electronically Signed   By: Marnee Spring M.D.   On: 07/20/2018 05:39    Cardiac Studies    TTE 07/20/2018 1. The left ventricle has normal  systolic function with an ejection fraction of 60-65%. The cavity size was normal. There is mildly increased left ventricular wall thickness. Left ventricular diastolic Doppler parameters are consistent with impaired  relaxation.  2. The right ventricle has normal systolic function. The cavity was normal. There is no increase in right ventricular wall thickness.Unable to estimate RVSP, presumed to be low given small IVC size.  3. Frequent PVCs  4. IVC is 1.1 cm  [facility] 01/15/2014 1. Coronary artery disease as detailed above including 30% proximal LAD, 30-40% mid/distal LAD, 30% proximal RCA, 30-40% mid LCx. 2. Previously placed stent in mid RCA is widely patent. Mild instent restenosis of 10% in previosly placed mid LAD stent. 3. Normal left ventricular end diastolic filling pressure. (LVEDP = 9 mmHg)  CT endovascular abdomen pelvis 08/30/2013 1. Unchanged right common iliac aneurysm. There is persistent soft tissue thickening surrounding the right common iliac artery and aorta at the bifurcation, however previously noted regions of increased density are not appreciated on the current exam. 2. Unchanged appearance of infrarenal abdominal aortic aneurysm. 3. Moderate right hydronephrosis with associated hydroureter to the level of above-described soft tissue at the bifurcation.  PET stress test 07/05/2013 - Abnormal study. - Global systolic function is severely reduced. The ejection fraction is calculated at 27% at rest and at 29% at stress. Left ventricular chamber  size is moderately dilated. - No evidence for regional ischemia. Balanced ischemia cannot be excluded. - Very small in size, subtle in severity, fixed defect involving the apical inferior segment. This is consistent with possible small scar and cannot rule out artifact. - Coronary calcifications are noted.  Patient Profile     85 y.o. male with a hx of hypertension, CAD s/p PCI to mid LAD and mid RCA, ICM, hyperlipidemia, AAA, ongoing  tobacco abuse, and emphysema who is being seen today for the evaluation of syncope and bigeminy.  Assessment & Plan    Syncopal episode, unwitnessed -No further syncope or near syncope symptoms since admission.  Resolution of abdominal pain and nausea with Reglan during admission, per patient report.   - Vascular surgery consulted with CTA today for L sided occlusion to clarify severity. CTA showed L ICA occluded at origin through cavernous segment; R carotid with 60% stenosis at proximal RICA. Vascular surgery started on Plavix. -Consider etiology of syncopal episode d/t vasovagal with nausea and emesis versus L sided occlusion. Echo above with normal EF. Known h/o ectopy/PVCs, chronic palpitations. No significant arrhythmia on EKG/telemetry.  No further cardiac work-up recommended at this time.   - Continue current cardiac medications (restart BB as below) with outpatient follow-up at [facility] cardiology / vascular surgery.    Frequent PVCs / Ectopy - Patient reporting palpitations with frequent PVCs / bigeminy on telemetry. PVCs are not a new finding per patient. Electrolytes stable per most recent blood work. No significant arrhythmia on telemetry.   Hypertensive urgency / uncontrolled hypertension -BP remains uncontrolled with SBP close to 170s.  - BB held at admission. Restart BB with Toprol  daily prior to discharge. Reassess BP prior to discharge to ensure adequately controlled with BB added back and that does not need titration for BP optimization.  - Continue PTA lisinopril and spiroronolactone. Restart BB as directly above. Home BP checks / BP home cuff and better control of blood pressure for aggressive risk factor modification. Follow-up with PCP.  CAD status post PCI to LAD and RCA -No current chest pain.  Followed by [facility] cardiology with last cardiac catheterization in 2015 and without intervention.   - Continue PTA ASA and Plavix (per vascular surgery), atorvastatin, lisinopril,  Spironolactone, and restart beta-blocker with Toprol before discharge.  Recommend smoking cessation and BP control for risk factor modification. Follow-up with [facility] and vascular surgery.  Abdominal pain with h/o GERD - Started on Pepcid/famotidine with resolution in reflux symptoms.  Continue at discharge.   Current tobacco abuse -Cessation advised to prevent worsening vascular disease  AAA carotid artery stenosis -Vascular surgery consulted.   Follow-up per vascular surgery.    For questions or updates, please contact [facility] Please consult [email or web address] for contact info under        Signed, Lennon Alstrom, PA-C  07/21/2018, 10:16 AM

## 2019-11-17 IMAGING — CT CT ANGIOGRAPHY NECK
2 of 7 series · 8 of 33 positions shown · IV contrast (APPLIED)
Comparison: Carotid ultrasound 07/20/2018

CLINICAL DATA: Carotid stenosis.  Syncope.

EXAM:
CT ANGIOGRAPHY NECK
TECHNIQUE: Multidetector CT imaging of the neck was performed using the
standard protocol during bolus administration of intravenous
contrast. Multiplanar CT image reconstructions and MIPs were
obtained to evaluate the vascular anatomy. Carotid stenosis
measurements (when applicable) are obtained utilizing NASCET
criteria, using the distal internal carotid diameter as the
denominator.
CONTRAST:  75mL OMNIPAQUE IOHEXOL 350 MG/ML SOLN

[Series 4: cta neck · axial · 0.47mm/px · z∈[-180,-90]mm · 2 of 136 slices shown]
[im 46/136  soft-tissue]
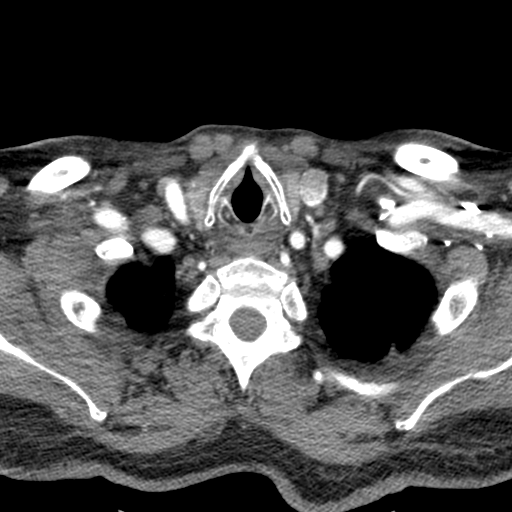
[im 91/136  soft-tissue]
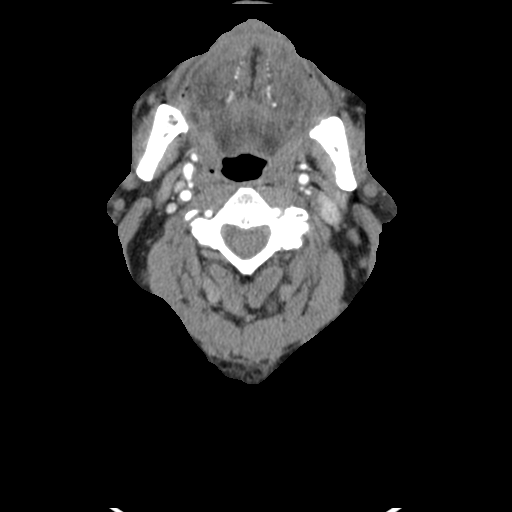

[Series 6: ax thin · axial · 0.58mm/px · z∈[-231,-31]mm · 6 of 280 slices shown]
[im 40/280  soft-tissue]
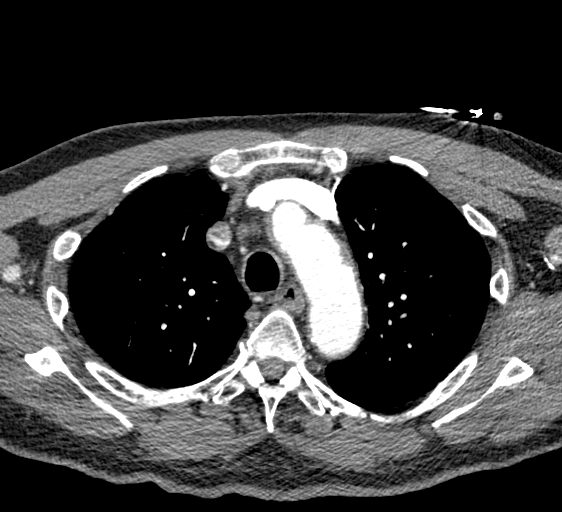
[im 80/280  bone]
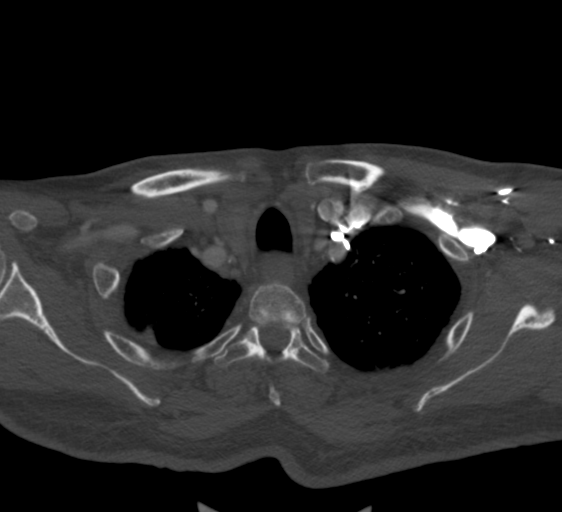
[im 120/280  soft-tissue]
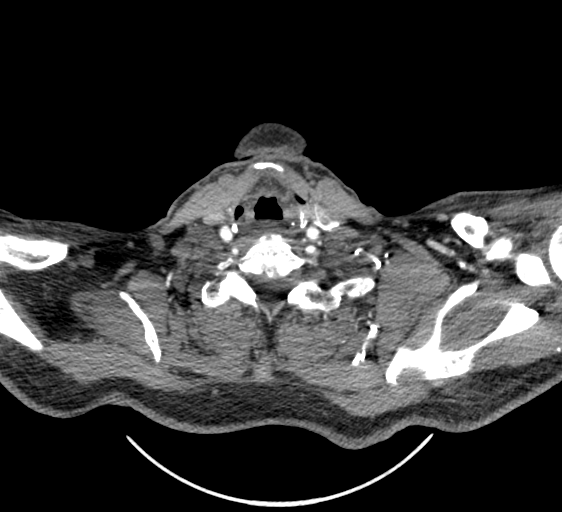
[im 160/280  bone]
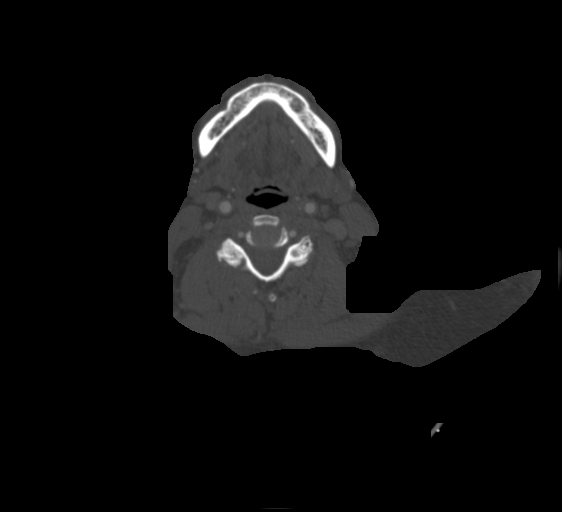
[im 200/280  soft-tissue]
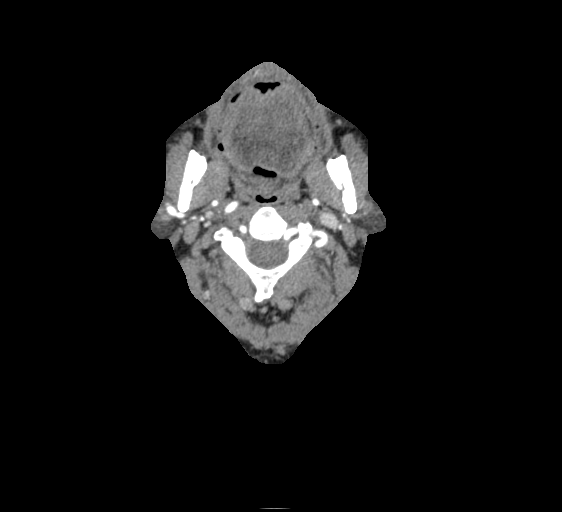
[im 240/280  bone]
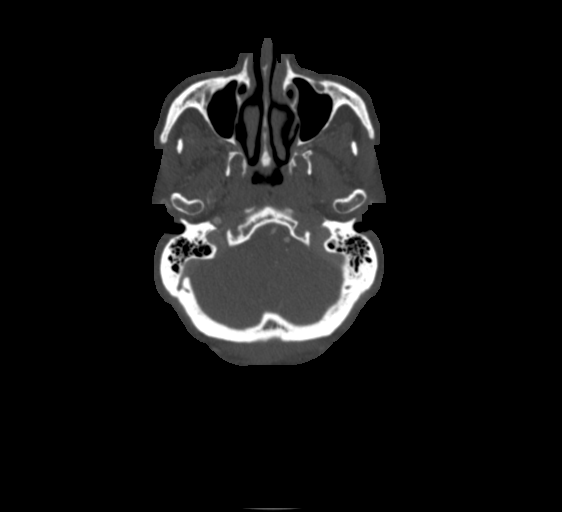

[8 of 33 positions shown; findings below may reference images not displayed]

FINDINGS: Aortic arch: Mild atherosclerotic disease aortic arch without
aneurysm or dissection. Proximal great vessels patent without
significant stenosis.

Right carotid system: Mild atherosclerotic plaque right common
carotid artery. Calcified plaque right carotid bifurcation. 60%
diameter stenosis proximal right internal carotid artery 15 mm above
the bifurcation. External carotid artery widely patent

Left carotid system: Mild atherosclerotic plaque left common carotid
artery without stenosis. Occlusion of the left internal carotid
artery at the origin. This remains occluded through the cavernous
segment. External carotid artery widely patent on the left.

Vertebral arteries: Mild stenosis origin of right vertebral artery.
Right vertebral artery patent to the basilar.

Left vertebral artery patent to the basilar without significant
stenosis.

Skeleton: No acute skeletal abnormality.

Other neck: Negative for mass or adenopathy in the neck.

Upper chest: Apical emphysema without acute abnormality.
IMPRESSION: 60% diameter stenosis proximal right internal carotid artery

Left internal carotid artery occluded at the origin through the
cavernous segment.

Both vertebral arteries patent to the basilar. Mild stenosis right
vertebral artery origin.
